# Patient Record
Sex: Female | Born: 1995 | Race: White | Hispanic: No | Marital: Married | Smoking: Former smoker
Health system: Southern US, Community
[De-identification: ages and names within clinical notes are randomized; demographics above are authoritative.]

## PROBLEM LIST (undated history)

## (undated) DIAGNOSIS — F419 Anxiety disorder, unspecified: Secondary | ICD-10-CM

## (undated) DIAGNOSIS — F319 Bipolar disorder, unspecified: Secondary | ICD-10-CM

## (undated) DIAGNOSIS — F4310 Post-traumatic stress disorder, unspecified: Secondary | ICD-10-CM

## (undated) DIAGNOSIS — F329 Major depressive disorder, single episode, unspecified: Secondary | ICD-10-CM

## (undated) DIAGNOSIS — F32A Depression, unspecified: Secondary | ICD-10-CM

## (undated) DIAGNOSIS — F1910 Other psychoactive substance abuse, uncomplicated: Secondary | ICD-10-CM

## (undated) DIAGNOSIS — F909 Attention-deficit hyperactivity disorder, unspecified type: Secondary | ICD-10-CM

---
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
## 2014-12-22 ENCOUNTER — Other Ambulatory Visit: Payer: Self-pay | Admitting: Physician Assistant

## 2017-05-08 ENCOUNTER — Emergency Department (HOSPITAL_COMMUNITY)
Admission: EM | Admit: 2017-05-08 | Discharge: 2017-05-09 | Disposition: A | Discharge status: Home / Self Care | Payer: BLUE CROSS/BLUE SHIELD | Attending: Emergency Medicine | Admitting: Emergency Medicine

## 2017-05-08 ENCOUNTER — Other Ambulatory Visit: Payer: Self-pay

## 2017-05-08 ENCOUNTER — Emergency Department (HOSPITAL_COMMUNITY): Payer: BLUE CROSS/BLUE SHIELD

## 2017-05-08 ENCOUNTER — Encounter (HOSPITAL_COMMUNITY): Payer: Self-pay | Admitting: Emergency Medicine

## 2017-05-08 DIAGNOSIS — L03113 Cellulitis of right upper limb: Secondary | ICD-10-CM | POA: Diagnosis not present

## 2017-05-08 DIAGNOSIS — Z79899 Other long term (current) drug therapy: Secondary | ICD-10-CM | POA: Insufficient documentation

## 2017-05-08 DIAGNOSIS — F17210 Nicotine dependence, cigarettes, uncomplicated: Secondary | ICD-10-CM | POA: Diagnosis not present

## 2017-05-08 DIAGNOSIS — L03119 Cellulitis of unspecified part of limb: Secondary | ICD-10-CM

## 2017-05-08 HISTORY — DX: Other psychoactive substance abuse, uncomplicated: F19.10

## 2017-05-08 LAB — CBC WITH DIFFERENTIAL/PLATELET
Basophils Absolute: 0 10*3/uL (ref 0.0–0.1)
Basophils Relative: 1 %
Eosinophils Absolute: 0.3 10*3/uL (ref 0.0–0.7)
Eosinophils Relative: 4 %
HCT: 38 % (ref 36.0–46.0)
Hemoglobin: 13 g/dL (ref 12.0–15.0)
Lymphocytes Relative: 25 %
Lymphs Abs: 1.6 10*3/uL (ref 0.7–4.0)
MCH: 32.1 pg (ref 26.0–34.0)
MCHC: 34.2 g/dL (ref 30.0–36.0)
MCV: 93.8 fL (ref 78.0–100.0)
Monocytes Absolute: 0.4 10*3/uL (ref 0.1–1.0)
Monocytes Relative: 6 %
Neutro Abs: 4 10*3/uL (ref 1.7–7.7)
Neutrophils Relative %: 64 %
Platelets: 249 10*3/uL (ref 150–400)
RBC: 4.05 MIL/uL (ref 3.87–5.11)
RDW: 12 % (ref 11.5–15.5)
WBC: 6.2 10*3/uL (ref 4.0–10.5)

## 2017-05-08 LAB — RAPID URINE DRUG SCREEN, HOSP PERFORMED
Amphetamines: POSITIVE — AB
Barbiturates: NOT DETECTED
Benzodiazepines: POSITIVE — AB
Cocaine: NOT DETECTED
Opiates: POSITIVE — AB
Tetrahydrocannabinol: POSITIVE — AB

## 2017-05-08 LAB — COMPREHENSIVE METABOLIC PANEL
ALT: 13 U/L — ABNORMAL LOW (ref 14–54)
AST: 19 U/L (ref 15–41)
Albumin: 3.7 g/dL (ref 3.5–5.0)
Alkaline Phosphatase: 72 U/L (ref 38–126)
Anion gap: 8 (ref 5–15)
BUN: 9 mg/dL (ref 6–20)
CO2: 25 mmol/L (ref 22–32)
Calcium: 9.1 mg/dL (ref 8.9–10.3)
Chloride: 105 mmol/L (ref 101–111)
Creatinine, Ser: 0.7 mg/dL (ref 0.44–1.00)
GFR calc Af Amer: >60 mL/min (ref >60)
GFR calc non Af Amer: >60 mL/min (ref >60)
Glucose, Bld: 75 mg/dL (ref 65–99)
Potassium: 4 mmol/L (ref 3.5–5.1)
Sodium: 138 mmol/L (ref 135–145)
Total Bilirubin: 0.4 mg/dL (ref 0.3–1.2)
Total Protein: 7.3 g/dL (ref 6.5–8.1)

## 2017-05-08 LAB — URINALYSIS, ROUTINE W REFLEX MICROSCOPIC
Bacteria, UA: NONE SEEN
Bilirubin Urine: NEGATIVE
Glucose, UA: NEGATIVE mg/dL
Hgb urine dipstick: NEGATIVE
Ketones, ur: NEGATIVE mg/dL
Nitrite: NEGATIVE
Protein, ur: NEGATIVE mg/dL
Specific Gravity, Urine: 1.014 (ref 1.005–1.030)
pH: 6 (ref 5.0–8.0)

## 2017-05-08 LAB — I-STAT CG4 LACTIC ACID, ED: Lactic Acid, Venous: 1.08 mmol/L (ref 0.5–1.9)

## 2017-05-08 MED ORDER — CEPHALEXIN 500 MG PO CAPS
500.0000 mg | ORAL_CAPSULE | Freq: Once | ORAL | Status: AC
  Administered 2017-05-09: 500 mg via ORAL
  Filled 2017-05-08: qty 1

## 2017-05-08 MED ORDER — SULFAMETHOXAZOLE-TRIMETHOPRIM 800-160 MG PO TABS
1.0000 | ORAL_TABLET | Freq: Once | ORAL | Status: AC
  Administered 2017-05-09: 1 via ORAL
  Filled 2017-05-08: qty 1

## 2017-05-08 MED ORDER — SODIUM CHLORIDE 0.9 % IV BOLUS (SEPSIS)
1000.0000 mL | Freq: Once | INTRAVENOUS | Status: AC
  Administered 2017-05-08: 1000 mL via INTRAVENOUS

## 2017-05-08 NOTE — ED Notes (Signed)
Patient transported to X-ray 

## 2017-05-08 NOTE — ED Triage Notes (Signed)
Pt states she has an infection in her right arm  Pt has a red, raised area noted to her right AC  Pt states it is from drug use  States her aunt gave her some antibiotics that she has been taking for the past two days but they are not helping  States area has been there for the past few days

## 2017-05-09 MED ORDER — CEPHALEXIN 500 MG PO CAPS: 500.0000 mg | ORAL_CAPSULE | Freq: Four times a day (QID) | ORAL | 0 refills | Status: AC

## 2017-05-09 MED ORDER — SULFAMETHOXAZOLE-TRIMETHOPRIM 800-160 MG PO TABS: 1.0000 | ORAL_TABLET | Freq: Two times a day (BID) | ORAL | 0 refills | Status: AC

## 2017-05-09 NOTE — Discharge Instructions (Addendum)
Please take all of your antibiotics until finished!   You may develop abdominal discomfort or diarrhea from the antibiotic.  You may help offset this with probiotics which you can buy or get in yogurt. Do not eat  or take the probiotics until 2 hours after your antibiotic.    Alternate 600 mg of ibuprofen and [id number] mg of Tylenol every 3 hours as needed for pain. Do not exceed 4000 mg of Tylenol daily.   Return to the ED in 2 days for wound check.  Return sooner if any concerning signs or symptoms develop such as fever, abnormal drainage, worsening spread of redness.  I have attached outpatient resources to help with substance abuse.  I have also contacted the peer support specialist.  They will call you to help arrange a meeting.

## 2017-05-09 NOTE — ED Provider Notes (Signed)
[facility]-EMERGENCY DEPT Provider Note   CSN: [id number] Arrival date & time: 05/08/17  1819     History   Chief Complaint Chief Complaint  Patient presents with  . Abscess    HPI Joann Price is a 21 y.o. female with history of substance abuse presents today with chief complaint acute onset, progressively worsening area of redness and tenderness to the right arm.  Patient states that she injected meth to the right AC fossa 4 days ago and 2 days later she began developing an area of erythema and tenderness to the injection site.  She notes intermittent aching pain which does not radiate.  She does not think it has been draining.  She denies any fevers or chills.  Denies numbness, tingling, or weakness but has generalized myalgias and fatigue.  She states her aunt gave her an unknown antibiotic which she has had 4 doses of and has not been helpful.  She also takes Adderall, oxycodone (not prescribed to her), and uses marijuana occasionally.  Denies chest pain or shortness of breath.  She appears sleepy on my initial assessment, but patient's boyfriend states that she has been quite tired in the past several days due to withdrawing from methamphetamine use.  He also states that at baseline she is typically very tired and is difficult to awake when she is asleep.  He notes no altered mental status and states she is behaving normally.  The history is provided by the patient and a significant other.    Past Medical History:  Diagnosis Date  . Substance abuse (HCC)     There are no active problems to display for this patient.   History reviewed. No pertinent surgical history.  OB History    No data available       Home Medications    Prior to Admission medications   Medication Sig Start Date End Date Taking? Authorizing Provider  ADDERALL XR 30 MG 24 hr capsule Take 30 mg by mouth daily.  05/06/17  Yes [provider]  amphetamine-dextroamphetamine  (ADDERALL) 20 MG tablet Take 20 mg by mouth daily. 04/23/17  Yes [provider]  clonazePAM (KLONOPIN) 1 MG tablet Take 1 mg by mouth 2 (two) times daily as needed for anxiety.  04/23/17  Yes [provider]  escitalopram (LEXAPRO) 10 MG tablet Take 10 mg by mouth daily. 04/23/17  Yes [provider]  cephALEXin (KEFLEX) 500 MG capsule Take 1 capsule (500 mg total) by mouth 4 (four) times daily for 7 days. 05/09/17 05/16/17  Michela Pitcher A, PA-C  sulfamethoxazole-trimethoprim (BACTRIM DS,SEPTRA DS) 800-160 MG tablet Take 1 tablet by mouth 2 (two) times daily for 7 days. 05/09/17 05/16/17  Jeanie Sewer, PA-C    Family History Family History  Problem Relation Age of Onset  . CAD Other     Social History Social History   Tobacco Use  . Smoking status: Current Some Day Smoker  . Smokeless tobacco: Never Used  Substance Use Topics  . Alcohol use: No    Frequency: Never  . Drug use: Yes    Types: Methamphetamines     Allergies   Patient has no known allergies.   Review of Systems Review of Systems  Constitutional: Positive for fatigue. Negative for chills and fever.  Respiratory: Negative for shortness of breath.   Cardiovascular: Negative for chest pain.  Gastrointestinal: Negative for abdominal pain, nausea and vomiting.  Skin: Positive for color change and wound.  Neurological: Negative  for syncope, weakness and headaches.  Psychiatric/Behavioral: Negative for confusion.  All other systems reviewed and are negative.    Physical Exam Updated Vital Signs BP 102/83   Pulse 84   Temp 99.1 F (37.3 C) (Oral)   Resp 17   LMP 05/01/2017 (Approximate)   SpO2 97%   Physical Exam  Constitutional: She is oriented to person, place, and time. She appears well-developed and well-nourished. No distress.  HENT:  Head: Normocephalic and atraumatic.  Eyes: Conjunctivae and EOM are normal. Pupils are equal, round, and reactive to light. Right eye exhibits  no discharge. Left eye exhibits no discharge.  Neck: No JVD present. No tracheal deviation present.  Cardiovascular: Regular rhythm, normal heart sounds and intact distal pulses.  Tachycardic, 2+ radial pulses bilaterally  Pulmonary/Chest: Effort normal and breath sounds normal.  Abdominal: Soft. Bowel sounds are normal. She exhibits no distension.  Musculoskeletal: Normal range of motion. She exhibits no edema.  Neurological: She is alert and oriented to person, place, and time. No sensory deficit. She exhibits normal muscle tone.  Fluent speech, no facial droop, sensation intact to soft touch of bilateral upper extremities.  She is lying with her eyes closed, but easily arousable and answers questions appropriately.  She follows commands without difficulty.   Skin: Skin is warm and dry. There is erythema.  4 cm x 8 cm area of induration to the volar aspect of the right forearm in the AC fossa.  Tender to palpation.  No drainage noted.  Track marks noted. No fluctuance noted.  Psychiatric: She has a normal mood and affect. Her behavior is normal.  Nursing note and vitals reviewed.    ED Treatments / Results  Labs (all labs ordered are listed, but only abnormal results are displayed) Labs Reviewed  COMPREHENSIVE METABOLIC PANEL - Abnormal; Notable for the following components:      Result Value   ALT 13 (*)    All other components within normal limits  URINALYSIS, ROUTINE W REFLEX MICROSCOPIC - Abnormal; Notable for the following components:   Leukocytes, UA TRACE (*)    Squamous Epithelial / LPF 0-5 (*)    All other components within normal limits  RAPID URINE DRUG SCREEN, HOSP PERFORMED - Abnormal; Notable for the following components:   Opiates POSITIVE (*)    Benzodiazepines POSITIVE (*)    Amphetamines POSITIVE (*)    Tetrahydrocannabinol POSITIVE (*)    All other components within normal limits  CULTURE, BLOOD (ROUTINE X 2)  CULTURE, BLOOD (ROUTINE X 2)  CBC WITH  DIFFERENTIAL/PLATELET  I-STAT CG4 LACTIC ACID, ED  I-STAT CG4 LACTIC ACID, ED    EKG  EKG Interpretation  Date/Time:  Wednesday May 08 2017 20:52:16 EST Ventricular Rate:  102 PR Interval:    QRS Duration: 85 QT Interval:  347 QTC Calculation: 452 R Axis:   109 Text Interpretation:  Sinus tachycardia Borderline right axis deviation No significant change since last tracing Confirmed by Alvira MondaySchlossman, Erin ([id number]) on 05/08/2017 10:27:00 PM       Radiology Dg Elbow Complete Right  Result Date: 05/08/2017 CLINICAL DATA:  Above elbow abscess. EXAM: RIGHT ELBOW - COMPLETE 3+ VIEW COMPARISON:  None. FINDINGS: There is no evidence of fracture, dislocation, or joint effusion. There is no evidence of arthropathy or other focal bone abnormality. Anterior soft tissue swelling above and below the elbow. IMPRESSION: No evidence of osseous abnormalities. Anterior soft tissue swelling. Electronically Signed   By: Ted Mcalpineobrinka  Dimitrova M.D.   On: 05/08/2017 23:30  Procedures Procedures (including critical care time) EMERGENCY DEPARTMENT US SOFT TISSUE INTERPRETATION "Study: Limited Soft Tissue Ultrasound"  INDICATIONS: Soft tissue infection Multiple views of the body part were obtained in real-time with a multi-frequency linear probe  PERFORMED BY: Myself IMAGES ARCHIVED?: Yes SIDE:Right  BODY PART:Upper extremity INTERPRETATION:  Cellulitis present    Medications Ordered in ED Medications  sodium chloride 0.9 % bolus 1,000 mL (0 mLs Intravenous Stopped 05/08/17 2241)  sulfamethoxazole-trimethoprim (BACTRIM DS,SEPTRA DS) 800-160 MG per tablet 1 tablet (1 tablet Oral Given 05/09/17 0002)  cephALEXin (KEFLEX) capsule 500 mg (500 mg Oral Given 05/09/17 0002)     Initial Impression / Assessment and Plan / ED Course  I have reviewed the triage vital signs and the nursing notes.  Pertinent labs & imaging results that were available during my care of the patient were reviewed by me and  considered in my medical decision making (see chart for details).     Patient presents with right antecubital fossa cellulitis secondary to methamphetamine use.  Afebrile, initially tachycardic with improvement while in the ED.  She is drowsy but easily arousable while in the ED and patient's boyfriend states that this is her baseline.  Radiographs reviewed by me show no fracture or dislocation or retained foreign bodies such as needles.  Does show anterior soft tissue swelling.  UDS is positive for opiates, benzos, amphetamines, and THC.  No lactic acidosis.  No leukocytosis, no significant electrolyte abnormalities.  Bedside ultrasound performed showing evidence of cellulitis but no evidence of definitive abscess.  Low suspicion of DVT. Will discharge with Bactrim and Keflex, area of erythema and induration marked by the nurse.  Instructed patient and boyfriend to return to the ED if any concerning signs or symptoms develop such as abnormal drainage, fevers, or worsening spread of redness or streaking.  She will follow-up with PCP for reevaluation.  She was also given outpatient resources for substance abuse and peer support specialists were consulted. Pt and patient's boyfriend verbalized understanding of and agreement with plan and patient is safe for discharge home at this time.  Final Clinical Impressions(s) / ED Diagnoses   Final diagnoses:  Cellulitis of antecubital fossa    ED Discharge Orders        Ordered    sulfamethoxazole-trimethoprim (BACTRIM DS,SEPTRA DS) 800-160 MG tablet  2 times daily     05/09/17 0000    cephALEXin (KEFLEX) 500 MG capsule  4 times daily     05/09/17 0000       Jeanie SewerFawze, Newman Waren A, PA-C 05/09/17 [id number]    Alvira MondaySchlossman, Erin, MD 05/10/17 1451

## 2017-05-14 LAB — CULTURE, BLOOD (ROUTINE X 2)
Culture: NO GROWTH
Culture: NO GROWTH
Special Requests: ADEQUATE

## 2017-10-14 ENCOUNTER — Emergency Department (HOSPITAL_COMMUNITY): Payer: PRIVATE HEALTH INSURANCE

## 2017-10-14 ENCOUNTER — Emergency Department (HOSPITAL_COMMUNITY)
Admission: EM | Admit: 2017-10-14 | Discharge: 2017-10-14 | Disposition: A | Discharge status: Home / Self Care | Payer: PRIVATE HEALTH INSURANCE | Attending: Emergency Medicine | Admitting: Emergency Medicine

## 2017-10-14 DIAGNOSIS — Z79899 Other long term (current) drug therapy: Secondary | ICD-10-CM | POA: Insufficient documentation

## 2017-10-14 DIAGNOSIS — F1910 Other psychoactive substance abuse, uncomplicated: Secondary | ICD-10-CM | POA: Insufficient documentation

## 2017-10-14 LAB — CBC WITH DIFFERENTIAL/PLATELET
BASOS ABS: 0 10*3/uL (ref 0.0–0.1)
BASOS PCT: 1 %
EOS PCT: 2 %
Eosinophils Absolute: 0.1 10*3/uL (ref 0.0–0.7)
HEMATOCRIT: 37.7 % (ref 36.0–46.0)
Hemoglobin: 12.6 g/dL (ref 12.0–15.0)
Lymphocytes Relative: 40 %
Lymphs Abs: 1.7 10*3/uL (ref 0.7–4.0)
MCH: 31.4 pg (ref 26.0–34.0)
MCHC: 33.4 g/dL (ref 30.0–36.0)
MCV: 94 fL (ref 78.0–100.0)
MONO ABS: 0.3 10*3/uL (ref 0.1–1.0)
MONOS PCT: 7 %
NEUTROS ABS: 2.1 10*3/uL (ref 1.7–7.7)
Neutrophils Relative %: 50 %
PLATELETS: 216 10*3/uL (ref 150–400)
RBC: 4.01 MIL/uL (ref 3.87–5.11)
RDW: 12 % (ref 11.5–15.5)
WBC: 4.1 10*3/uL (ref 4.0–10.5)

## 2017-10-14 LAB — COMPREHENSIVE METABOLIC PANEL
ALBUMIN: 4.4 g/dL (ref 3.5–5.0)
ALT: 15 U/L (ref 14–54)
ANION GAP: 10 (ref 5–15)
AST: 21 U/L (ref 15–41)
Alkaline Phosphatase: 76 U/L (ref 38–126)
BILIRUBIN TOTAL: 0.3 mg/dL (ref 0.3–1.2)
BUN: 10 mg/dL (ref 6–20)
CHLORIDE: 110 mmol/L (ref 101–111)
CO2: 25 mmol/L (ref 22–32)
Calcium: 9.2 mg/dL (ref 8.9–10.3)
Creatinine, Ser: 0.73 mg/dL (ref 0.44–1.00)
GFR calc Af Amer: >60 mL/min (ref >60)
Glucose, Bld: 98 mg/dL (ref 65–99)
POTASSIUM: 4.1 mmol/L (ref 3.5–5.1)
Sodium: 145 mmol/L (ref 135–145)
TOTAL PROTEIN: 7.8 g/dL (ref 6.5–8.1)

## 2017-10-14 LAB — I-STAT CHEM 8, ED
BUN: 8 mg/dL (ref 6–20)
CHLORIDE: 107 mmol/L (ref 101–111)
Calcium, Ion: 1.17 mmol/L (ref 1.15–1.40)
Creatinine, Ser: 0.7 mg/dL (ref 0.44–1.00)
Glucose, Bld: 94 mg/dL (ref 65–99)
HCT: 36 % (ref 36.0–46.0)
Hemoglobin: 12.2 g/dL (ref 12.0–15.0)
POTASSIUM: 4.1 mmol/L (ref 3.5–5.1)
SODIUM: 145 mmol/L (ref 135–145)
TCO2: 26 mmol/L (ref 22–32)

## 2017-10-14 LAB — ETHANOL

## 2017-10-14 MED ORDER — SODIUM CHLORIDE 0.9 % IV BOLUS: 1000.0000 mL | Freq: Once | INTRAVENOUS | Status: DC

## 2017-10-14 MED ORDER — NALOXONE HCL 2 MG/2ML IJ SOSY
2.0000 mg | PREFILLED_SYRINGE | Freq: Once | INTRAMUSCULAR | Status: DC
  Filled 2017-10-14: qty 2

## 2017-10-14 NOTE — ED Notes (Signed)
Dr Zammit at bedside. 

## 2017-10-14 NOTE — ED Notes (Signed)
Bed: WA23 Expected date:  Expected time:  Means of arrival:  Comments: EMS 

## 2017-10-14 NOTE — ED Provider Notes (Signed)
[facility]-EMERGENCY DEPT Provider Note   CSN: [id number] Arrival date & time: 10/14/17  0808     History   Chief Complaint Chief Complaint  Patient presents with  . Drug Overdose    HPI Joann Price is a 22 y.o. female.  Patient was brought in by paramedics not responding anything but painful stimuli.   According to the person I was with her he was trying to do a tattoo on her and she became unresponsive and [place]inn on herself.  States that way and he called paramedics.  Patient supposedly had been using some methamphetamines prior to the tattoo session  The history is provided by the patient and a friend. No language interpreter was used.  Drug Overdose  This is a recurrent problem. The current episode started 1 to 2 hours ago. The problem occurs every several days. The problem has not changed since onset.Pertinent negatives include no chest pain and no abdominal pain. Nothing aggravates the symptoms. Nothing relieves the symptoms. She has tried nothing for the symptoms. The treatment provided no relief.    Past Medical History:  Diagnosis Date  . Substance abuse (HCC)     There are no active problems to display for this patient.   No past surgical history on file.   OB History   None      Home Medications    Prior to Admission medications   Medication Sig Start Date End Date Taking? Authorizing Provider  ibuprofen (ADVIL,MOTRIN) 200 MG tablet Take 800 mg by mouth every 6 (six) hours as needed for moderate pain.   Yes [provider]    Family History Family History  Problem Relation Age of Onset  . CAD Other     Social History Social History   Tobacco Use  . Smoking status: Current Some Day Smoker  . Smokeless tobacco: Never Used  Substance Use Topics  . Alcohol use: No    Frequency: Never  . Drug use: Yes    Types: Methamphetamines     Allergies   Patient has no known allergies.   Review of Systems Review of  Systems  Unable to perform ROS: Mental status change  Cardiovascular: Negative for chest pain.  Gastrointestinal: Negative for abdominal pain.     Physical Exam Updated Vital Signs BP 118/87   Pulse 66   Temp 98.9 F (37.2 C) (Rectal)   Resp 16   SpO2 100%   Physical Exam  Constitutional: She appears well-developed.  HENT:  Head: Normocephalic.  Patient did have a very good gag reflex  Eyes: Conjunctivae and EOM are normal. No scleral icterus.  Neck: Neck supple. No thyromegaly present.  Cardiovascular: Normal rate and regular rhythm. Exam reveals no gallop and no friction rub.  No murmur heard. Pulmonary/Chest: No stridor. She has no wheezes. She has no rales. She exhibits no tenderness.  Abdominal: She exhibits no distension. There is no tenderness. There is no rebound.  Musculoskeletal: Normal range of motion. She exhibits no edema.  Lymphadenopathy:    She has no cervical adenopathy.  Neurological: She exhibits normal muscle tone. Coordination normal.  She only responding to painful stimuli but moving all extremities  Skin: No rash noted. No erythema.     ED Treatments / Results  Labs (all labs ordered are listed, but only abnormal results are displayed) Labs Reviewed  CBC WITH DIFFERENTIAL/PLATELET  COMPREHENSIVE METABOLIC PANEL  ETHANOL  URINALYSIS, ROUTINE W REFLEX MICROSCOPIC  RAPID URINE DRUG SCREEN, HOSP PERFORMED  I-STAT CHEM 8, ED    EKG None  Radiology Dg Chest Port 1 View  Result Date: 10/14/2017 CLINICAL DATA:  Drug overdose. EXAM: PORTABLE CHEST 1 VIEW COMPARISON:  None in PACs FINDINGS: The left lung is clear. There is mild elevation of the right hemidiaphragm. There is no evidence of pneumonia. The heart and mediastinal structures are normal. The trachea is midline. The bony thorax is unremarkable. IMPRESSION: No definite acute cardiopulmonary abnormality. Mild elevation of the right hemidiaphragm of uncertain etiology and duration.  Electronically Signed   By: David  [place] M.D.   On: 10/14/2017 08:42    Procedures Procedures (including critical care time)  Medications Ordered in ED Medications  naloxone (NARCAN) injection 2 mg (has no administration in time range)  sodium chloride 0.9 % bolus 1,000 mL (has no administration in time range)   CRITICAL CARE Performed by: Bethann Berkshire Total critical care time: 35 minutes Critical care time was exclusive of separately billable procedures and treating other patients. Critical care was necessary to treat or prevent imminent or life-threatening deterioration. Critical care was time spent personally by me on the following activities: development of treatment plan with patient and/or surrogate as well as nursing, discussions with consultants, evaluation of patient's response to treatment, examination of patient, obtaining history from patient or surrogate, ordering and performing treatments and interventions, ordering and review of laboratory studies, ordering and review of radiographic studies, pulse oximetry and re-evaluation of patient's condition.   Initial Impression / Assessment and Plan / ED Course  I have reviewed the triage vital signs and the nursing notes.  Pertinent labs & imaging results that were available during my care of the patient were reviewed by me and considered in my medical decision making (see chart for details). Patient was given Narcan IV but did not help any.  Over time the patient did wake up and stated that she did not want to be taking care of.  She did not want to get CAT scan and did not want to do urine test she just wanted outpatient referrals to substance abuse.  Patient was alert and oriented when she left      Final Clinical Impressions(s) / ED Diagnoses   Final diagnoses:  Polysubstance abuse [facility])    ED Discharge Orders    None       Bethann Berkshire, MD 10/14/17 1034

## 2017-10-14 NOTE — ED Triage Notes (Signed)
Per EMS pt from a  home with a c/o drug overdose. Per EMS pt boyfriend's reported he was tattooing pt when she collapsed. Per EMS pt injected meth, had 3-4 mixed drinks and possibly took 8 ativans prior to the incident. Per EMS pt responsive to painful stimulation.

## 2017-10-14 NOTE — ED Notes (Signed)
Pt's IV has been discontinued at this time.

## 2017-10-14 NOTE — Discharge Instructions (Addendum)
Follow up with out pt tx of substance abuse.   Return if problems

## 2017-10-14 NOTE — ED Notes (Signed)
Pt sts she wants to leave, Dr Estell Harpin notified

## 2018-05-31 ENCOUNTER — Other Ambulatory Visit: Payer: Self-pay

## 2018-05-31 ENCOUNTER — Emergency Department (HOSPITAL_COMMUNITY)
Admission: EM | Admit: 2018-05-31 | Discharge: 2018-05-31 | Discharge status: Against Medical Advice | Payer: Self-pay | Attending: Emergency Medicine | Admitting: Emergency Medicine

## 2018-05-31 ENCOUNTER — Encounter (HOSPITAL_COMMUNITY): Payer: Self-pay | Admitting: *Deleted

## 2018-05-31 DIAGNOSIS — T401X1A Poisoning by heroin, accidental (unintentional), initial encounter: Secondary | ICD-10-CM | POA: Insufficient documentation

## 2018-05-31 DIAGNOSIS — Z5321 Procedure and treatment not carried out due to patient leaving prior to being seen by health care provider: Secondary | ICD-10-CM | POA: Insufficient documentation

## 2018-05-31 NOTE — ED Triage Notes (Signed)
Per EMS, pt OD on Heroin tonight, her boyfriend on scene reported giving the pt narcan twice intranasal and twice IM.  Pt is A&O x 4 and in NAD.  Denies any pain.  She is cooperative but appears anxious.  She is worried how she is getting home.

## 2018-05-31 NOTE — ED Notes (Signed)
Pt's boyfriend showed up and she left AMA

## 2019-01-10 ENCOUNTER — Inpatient Hospital Stay (HOSPITAL_COMMUNITY): Payer: Medicaid Other

## 2019-01-10 ENCOUNTER — Inpatient Hospital Stay (HOSPITAL_COMMUNITY)
Admission: AD | Admit: 2019-01-10 | Discharge: 2019-01-11 | Disposition: A | Discharge status: Home / Self Care | Payer: Medicaid Other | Attending: Family Medicine | Admitting: Family Medicine

## 2019-01-10 ENCOUNTER — Other Ambulatory Visit: Payer: Self-pay

## 2019-01-10 ENCOUNTER — Encounter (HOSPITAL_COMMUNITY): Payer: Self-pay

## 2019-01-10 DIAGNOSIS — O418X10 Other specified disorders of amniotic fluid and membranes, first trimester, not applicable or unspecified: Secondary | ICD-10-CM

## 2019-01-10 DIAGNOSIS — Z3A01 Less than 8 weeks gestation of pregnancy: Secondary | ICD-10-CM | POA: Insufficient documentation

## 2019-01-10 DIAGNOSIS — O468X1 Other antepartum hemorrhage, first trimester: Secondary | ICD-10-CM

## 2019-01-10 DIAGNOSIS — O209 Hemorrhage in early pregnancy, unspecified: Secondary | ICD-10-CM | POA: Diagnosis not present

## 2019-01-10 DIAGNOSIS — Z87891 Personal history of nicotine dependence: Secondary | ICD-10-CM | POA: Diagnosis not present

## 2019-01-10 HISTORY — DX: Post-traumatic stress disorder, unspecified: F43.10

## 2019-01-10 HISTORY — DX: Bipolar disorder, unspecified: F31.9

## 2019-01-10 HISTORY — DX: Depression, unspecified: F32.A

## 2019-01-10 HISTORY — DX: Anxiety disorder, unspecified: F41.9

## 2019-01-10 HISTORY — DX: Attention-deficit hyperactivity disorder, unspecified type: F90.9

## 2019-01-10 LAB — URINALYSIS, ROUTINE W REFLEX MICROSCOPIC
Bilirubin Urine: NEGATIVE
Glucose, UA: NEGATIVE mg/dL
Ketones, ur: NEGATIVE mg/dL
Leukocytes,Ua: NEGATIVE
Nitrite: NEGATIVE
Protein, ur: NEGATIVE mg/dL
Specific Gravity, Urine: 1.001 — ABNORMAL LOW (ref 1.005–1.030)
pH: 6 (ref 5.0–8.0)

## 2019-01-10 LAB — TYPE AND SCREEN
ABO/RH(D): B POS
Antibody Screen: NEGATIVE

## 2019-01-10 LAB — POCT PREGNANCY, URINE: Preg Test, Ur: POSITIVE — AB

## 2019-01-10 LAB — RAPID URINE DRUG SCREEN, HOSP PERFORMED
Amphetamines: NOT DETECTED
Barbiturates: NOT DETECTED
Benzodiazepines: NOT DETECTED
Cocaine: NOT DETECTED
Opiates: NOT DETECTED
Tetrahydrocannabinol: POSITIVE — AB

## 2019-01-10 LAB — CBC
HCT: 37.2 % (ref 36.0–46.0)
Hemoglobin: 12.3 g/dL (ref 12.0–15.0)
MCH: 32.2 pg (ref 26.0–34.0)
MCHC: 33.1 g/dL (ref 30.0–36.0)
MCV: 97.4 fL (ref 80.0–100.0)
Platelets: 249 10*3/uL (ref 150–400)
RBC: 3.82 MIL/uL — ABNORMAL LOW (ref 3.87–5.11)
RDW: 11.6 % (ref 11.5–15.5)
WBC: 8.2 10*3/uL (ref 4.0–10.5)
nRBC: 0 % (ref 0.0–0.2)

## 2019-01-10 LAB — WET PREP, GENITAL
Clue Cells Wet Prep HPF POC: NONE SEEN
Sperm: NONE SEEN
Trich, Wet Prep: NONE SEEN
Yeast Wet Prep HPF POC: NONE SEEN

## 2019-01-10 LAB — HCG, QUANTITATIVE, PREGNANCY: hCG, Beta Chain, Quant, S: 61898 m[IU]/mL — ABNORMAL HIGH (ref <5)

## 2019-01-10 MED ORDER — PREPLUS 27-1 MG PO TABS: 1.0000 | ORAL_TABLET | Freq: Once | ORAL | 3 refills | Status: AC

## 2019-01-10 NOTE — MAU Provider Note (Signed)
History     CSN: [id number]  Arrival date and time: 01/10/19 1938   None     Chief Complaint  Patient presents with  . Vaginal Bleeding  . Possible Pregnancy   Micael HampshireRioux Larose is a 23 y.o. G2P0010 at Unknown who receives care.  She presents today for Vaginal Bleeding and Possible Pregnancy.  She states she started having light bright red spotting today around 6pm.  She states she did not wear a pad, but it was "a lot and ruined my underwear."  Patient reports that she had some cramping that started after the bleeding, but it has since subsided.  Patient denies vaginal concerns including discharge, itching, or odor prior to the bleeding.  Patient reports that she took a home UPT last Saturday that was positive, but she is unsure of her LMP.  Patient states she does not have regular periods.    Of Note when provider walked in the room patient was standing up and SO was laying in the bed sleep.  After exam, patient heard yelling at SO and nurses in room to assess.  Patient endorses safety and declines need for further assistance or removal of SO from room.       OB History    Gravida  2   Para      Term      Preterm      AB  1   Living        SAB      TAB  1   Ectopic      Multiple      Live Births              Past Medical History:  Diagnosis Date  . ADHD   . Anxiety   . Bipolar 1 disorder (HCC)   . Depression   . PTSD (post-traumatic stress disorder)   . Substance abuse (HCC)     No past surgical history on file.  Family History  Problem Relation Age of Onset  . CAD Other     Social History   Tobacco Use  . Smoking status: Former Smoker    Types: Cigarettes    Quit date: 01/03/2019    Years since quitting: 0.0  . Smokeless tobacco: Never Used  Substance Use Topics  . Alcohol use: No    Frequency: Never  . Drug use: Yes    Types: Methamphetamines    Allergies: No Known Allergies  Medications Prior to Admission  Medication Sig Dispense  Refill Last Dose  . ibuprofen (ADVIL,MOTRIN) 200 MG tablet Take 800 mg by mouth every 6 (six) hours as needed for moderate pain.       Review of Systems  Constitutional: Negative for chills and fever.  Respiratory: Negative for cough and shortness of breath.   Gastrointestinal: Negative for abdominal pain, constipation, diarrhea, nausea and vomiting.  Genitourinary: Positive for vaginal bleeding. Negative for difficulty urinating, dysuria and vaginal discharge.  Musculoskeletal: Positive for back pain.  Neurological: Negative for dizziness, light-headedness and headaches.   Physical Exam   Blood pressure 106/70, pulse 94, temperature 98.7 F (37.1 C), temperature source Oral, resp. rate 18, height 5\' 1"  (1.549 m), weight 65.4 kg, SpO2 100 %.  Physical Exam  Constitutional: She is oriented to person, place, and time. She appears well-developed and well-nourished.  HENT:  Head: Normocephalic and atraumatic.  Eyes: Conjunctivae are normal.  Neck: Normal range of motion.  Cardiovascular: Normal rate.  Respiratory: Effort normal.  GI: Soft.  Genitourinary: Cervix exhibits no motion tenderness, no discharge and no friability.    Vaginal bleeding present.  There is bleeding in the vagina.    Genitourinary Comments: Speculum Exam: NEFG -Vaginal Vault: Pink mucosa with good rugae.  Small amt dark red blood in vault removed with faux swab x 2 -wet prep collected -Cervix:Pink, no lesions, cysts, or polyps.  Appears closed. No active bleeding from os-GC/CT collected -Bimanual Exam: Closed. Mild tenderness in cul de sac-right side.   Musculoskeletal: Normal range of motion.  Neurological: She is alert and oriented to person, place, and time.  Skin: Skin is warm and dry.  Psychiatric: She has a normal mood and affect. Her behavior is normal.    MAU Course  Procedures Results for orders placed or performed during the hospital encounter of 01/10/19 (from the past 24 hour(s))  Urinalysis,  Routine w reflex microscopic     Status: Abnormal   Collection Time: 01/10/19  8:48 PM  Result Value Ref Range   Color, Urine COLORLESS (A) YELLOW   APPearance CLEAR CLEAR   Specific Gravity, Urine 1.001 (L) 1.005 - 1.030   pH 6.0 5.0 - 8.0   Glucose, UA NEGATIVE NEGATIVE mg/dL   Hgb urine dipstick LARGE (A) NEGATIVE   Bilirubin Urine NEGATIVE NEGATIVE   Ketones, ur NEGATIVE NEGATIVE mg/dL   Protein, ur NEGATIVE NEGATIVE mg/dL   Nitrite NEGATIVE NEGATIVE   Leukocytes,Ua NEGATIVE NEGATIVE   RBC / HPF 0-5 0 - 5 RBC/hpf   WBC, UA 0-5 0 - 5 WBC/hpf   Bacteria, UA RARE (A) NONE SEEN   Squamous Epithelial / LPF 0-5 0 - 5  Rapid urine drug screen (hospital performed)     Status: Abnormal   Collection Time: 01/10/19  8:48 PM  Result Value Ref Range   Opiates NONE DETECTED NONE DETECTED   Cocaine NONE DETECTED NONE DETECTED   Benzodiazepines NONE DETECTED NONE DETECTED   Amphetamines NONE DETECTED NONE DETECTED   Tetrahydrocannabinol POSITIVE (A) NONE DETECTED   Barbiturates NONE DETECTED NONE DETECTED  Pregnancy, urine POC     Status: Abnormal   Collection Time: 01/10/19  8:49 PM  Result Value Ref Range   Preg Test, Ur POSITIVE (A) NEGATIVE  Wet prep, genital     Status: Abnormal   Collection Time: 01/10/19 10:34 PM   Specimen: Thin Prep Cervical/Endocervical  Result Value Ref Range   Yeast Wet Prep HPF POC NONE SEEN NONE SEEN   Trich, Wet Prep NONE SEEN NONE SEEN   Clue Cells Wet Prep HPF POC NONE SEEN NONE SEEN   WBC, Wet Prep HPF POC FEW (A) NONE SEEN   Sperm NONE SEEN   hCG, quantitative, pregnancy     Status: Abnormal   Collection Time: 01/10/19 10:40 PM  Result Value Ref Range   hCG, Beta Chain, Quant, S 61,898 (H) <5 mIU/mL  CBC     Status: Abnormal   Collection Time: 01/10/19 10:40 PM  Result Value Ref Range   WBC 8.2 4.0 - 10.5 K/uL   RBC 3.82 (L) 3.87 - 5.11 MIL/uL   Hemoglobin 12.3 12.0 - 15.0 g/dL   HCT 37.2 36.0 - 46.0 %   MCV 97.4 80.0 - 100.0 fL   MCH 32.2  26.0 - 34.0 pg   MCHC 33.1 30.0 - 36.0 g/dL   RDW 11.6 11.5 - 15.5 %   Platelets 249 150 - 400 K/uL   nRBC 0.0 0.0 - 0.2 %  Type and screen  Status: None   Collection Time: 01/10/19 10:40 PM  Result Value Ref Range   ABO/RH(D) B POS    Antibody Screen NEG    Sample Expiration      01/13/2019,2359 Performed at [facility], [address] [place], [place] [address]   ABO/Rh     Status: None (Preliminary result)   Collection Time: 01/10/19 10:40 PM  Result Value Ref Range   ABO/RH(D)      B POS Performed at [facility], [address] [place], [place] [address]    [place] Ob Less Than 14 Weeks With Ob Transvaginal  Result Date: 01/10/2019 CLINICAL DATA:  Nine weeks pregnant with vaginal bleeding EXAM: OBSTETRIC <14 WK US AND TRANSVAGINAL OB US TECHNIQUE: Both transabdominal and transvaginal ultrasound examinations were performed for complete evaluation of the gestation as well as the maternal uterus, adnexal regions, and pelvic cul-de-sac. Transvaginal technique was performed to assess early pregnancy. COMPARISON:  None. FINDINGS: Intrauterine gestational sac: Present Yolk sac:  Present Embryo:  Present Cardiac Activity: Present Heart Rate: 140 bpm CRL:  10.2 mm mm   7 w   1 d                  US EDC: 08/28/2019 Subchorionic hemorrhage: Moderate subchorionic hemorrhage is noted measuring approximately 2.6 x 2.1 cm. Maternal uterus/adnexae: Within normal limits. IMPRESSION: Single live intrauterine gestation at 7 weeks 1 day. Moderate subchorionic hemorrhage is noted. Electronically Signed   By: Alcide CleverMark  Lukens M.D.   On: 01/10/2019 23:51    MDM Pelvic Exam; Wet Prep and GC/CT Labs: UA, UPT, CBC, hCG, T&S, Drug Screen Ultrasound Assessment and Plan  23 year old G2P0010 at Unknown [place] Vaginal Bleeding  -Exam findings discussed. -POC discussed as above. -Informed that due to history of polysubstance abuse and overdose, will collect drug screen today. -Patient verbalizes  understanding and states she has not taken any drugs x 1 week. -Will send for US and await results.   Cherre RobinsJessica L Rania Prothero MSN, CNM 01/10/2019, 10:10 PM   Reassessment (12:02 AM) [facility]  -US and labs returned with findings as above. -Results discussed with patient. -Informed that GC/CT will return within 2-3 days. -Given EDD of 08/28/2019 -Rx for PNV sent to pharmacy on file.  -Informed of blood type of B+. -Instructed to discontinue usage of ibuprofen. -Discussed SCH findings of US and instructions given regarding bleeding and pelvic rest.  -[facility] -Will provide information on providers in the community. -Patient requests proof of pregnancy paperwork. -No other questions or concerns. -Encouraged to call or return to MAU if symptoms worsen or with the onset of new symptoms. -Discharged to home in stable condition.  Cherre RobinsJessica L Rachid Parham MSN, CNM

## 2019-01-10 NOTE — MAU Note (Signed)
Pt presents to MAU reporting a few days ago she had a +UPT and today she started bleeding. Pt is unsure of her LMP or her dating. Pt reports some cramping in her lower abdomen that is a 1-2/10.

## 2019-01-10 NOTE — MAU Note (Signed)
Pt walked to [place]. Pt reports feeling safe in her relationship.

## 2019-01-11 LAB — ABO/RH: ABO/RH(D): B POS

## 2019-01-11 NOTE — Discharge Instructions (Signed)
[place] for [place] at [facility]       Phone: [phone]  [facility] for [place] at [place] Phone: [place] for [employer] at [place]  Phone: [place] for [place] at [facility]  Phone: [place] for [place] at [facility]  Phone: [facility]       Phone: [phone]  [facility]    Phone: [phone]   [facility])    Phone: [facility]    Phone: [phone]  [facility]    Phone: [place] [facility]  Phone: [phone]  [place]    Phone: [phone]  [facility]   Phone: [phone]  [facility]    Phone: [phone] First Trimester of Pregnancy The first trimester of pregnancy is from week 1 until the end of week 13 (months 1 through 3). A week after a sperm fertilizes an egg, the egg will implant on the wall of the uterus. This embryo will begin to develop into a baby. Genes from you and your partner will form the baby. The female genes will determine whether the baby will be a boy or a girl. At 6-8 weeks, the eyes and face will be formed, and the heartbeat can be seen on ultrasound. At the end of 12 weeks, all the baby's organs will be formed. Now that you are pregnant, you will want to do everything you can to have a healthy baby. Two of the most important things are to get good prenatal care and to follow your health care provider's instructions. Prenatal care is all the medical care you receive before the baby's birth. This care will help prevent, find, and treat any problems during the pregnancy and childbirth. Body changes during your first trimester Your body goes through many changes during  pregnancy. The changes vary from woman to woman.  You may gain or lose a couple of pounds at first.  You may feel sick to your stomach (nauseous) and you may throw up (vomit). If the vomiting is uncontrollable, call your health care provider.  You may tire easily.  You may develop headaches that can be relieved by medicines. All medicines should be approved by your health care provider.  You may urinate more often. Painful urination may mean you have a bladder infection.  You may develop heartburn as a result of your pregnancy.  You may develop constipation because certain hormones are causing the muscles that push stool through your intestines to slow down.  You may develop hemorrhoids or swollen veins (varicose veins).  Your breasts may begin to grow larger and become tender. Your nipples may stick out more, and the tissue that surrounds them (areola) may become darker.  Your gums may bleed and may be sensitive to brushing and flossing.  Dark spots or blotches (chloasma, mask of pregnancy) may develop on your face. This will likely fade after the baby is born.  Your menstrual periods will stop.  You may have a loss of appetite.  You may develop cravings for certain kinds of food.  You may have changes in your emotions from day to day, such as being excited to be pregnant or being concerned that something may go wrong with the pregnancy and baby.  You may have more vivid and strange dreams.  You  may have changes in your hair. These can include thickening of your hair, rapid growth, and changes in texture. Some women also have hair loss during or after pregnancy, or hair that feels dry or thin. Your hair will most likely return to normal after your baby is born. What to expect at prenatal visits During a routine prenatal visit:  You will be weighed to make sure you and the baby are growing normally.  Your blood pressure will be taken.  Your abdomen will be measured to track  your baby's growth.  The fetal heartbeat will be listened to between weeks 10 and 14 of your pregnancy.  Test results from any previous visits will be discussed. Your health care provider may ask you:  How you are feeling.  If you are feeling the baby move.  If you have had any abnormal symptoms, such as leaking fluid, bleeding, severe headaches, or abdominal cramping.  If you are using any tobacco products, including cigarettes, chewing tobacco, and electronic cigarettes.  If you have any questions. Other tests that may be performed during your first trimester include:  Blood tests to find your blood type and to check for the presence of any previous infections. The tests will also be used to check for low iron levels (anemia) and protein on red blood cells (Rh antibodies). Depending on your risk factors, or if you previously had diabetes during pregnancy, you may have tests to check for high blood sugar that affects pregnant women (gestational diabetes).  Urine tests to check for infections, diabetes, or protein in the urine.  An ultrasound to confirm the proper growth and development of the baby.  Fetal screens for spinal cord problems (spina bifida) and Down syndrome.  HIV (human immunodeficiency virus) testing. Routine prenatal testing includes screening for HIV, unless you choose not to have this test.  You may need other tests to make sure you and the baby are doing well. Follow these instructions at home: Medicines  Follow your health care provider's instructions regarding medicine use. Specific medicines may be either safe or unsafe to take during pregnancy.  Take a prenatal vitamin that contains at least 600 micrograms (mcg) of folic acid.  If you develop constipation, try taking a stool softener if your health care provider approves. Eating and drinking   Eat a balanced diet that includes fresh fruits and vegetables, whole grains, good sources of protein such as  meat, eggs, or tofu, and low-fat dairy. Your health care provider will help you determine the amount of weight gain that is right for you.  Avoid raw meat and uncooked cheese. These carry germs that can cause birth defects in the baby.  Eating four or five small meals rather than three large meals a day may help relieve nausea and vomiting. If you start to feel nauseous, eating a few soda crackers can be helpful. Drinking liquids between meals, instead of during meals, also seems to help ease nausea and vomiting.  Limit foods that are high in fat and processed sugars, such as fried and sweet foods.  To prevent constipation: ? Eat foods that are high in fiber, such as fresh fruits and vegetables, whole grains, and beans. ? Drink enough fluid to keep your urine clear or pale yellow. Activity  Exercise only as directed by your health care provider. Most women can continue their usual exercise routine during pregnancy. Try to exercise for 30 minutes at least 5 days a week. Exercising will help you: ? Control your  weight. ? Stay in shape. ? Be prepared for labor and delivery.  Experiencing pain or cramping in the lower abdomen or lower back is a good sign that you should stop exercising. Check with your health care provider before continuing with normal exercises.  Try to avoid standing for long periods of time. Move your legs often if you must stand in one place for a long time.  Avoid heavy lifting.  Wear low-heeled shoes and practice good posture.  You may continue to have sex unless your health care provider tells you not to. Relieving pain and discomfort  Wear a good support bra to relieve breast tenderness.  Take warm sitz baths to soothe any pain or discomfort caused by hemorrhoids. Use hemorrhoid cream if your health care provider approves.  Rest with your legs elevated if you have leg cramps or low back pain.  If you develop varicose veins in your legs, wear support hose.  Elevate your feet for 15 minutes, 3-4 times a day. Limit salt in your diet. Prenatal care  Schedule your prenatal visits by the twelfth week of pregnancy. They are usually scheduled monthly at first, then more often in the last 2 months before delivery.  Write down your questions. Take them to your prenatal visits.  Keep all your prenatal visits as told by your health care provider. This is important. Safety  Wear your seat belt at all times when driving.  Make a list of emergency phone numbers, including numbers for family, friends, the hospital, and police and fire departments. General instructions  Ask your health care provider for a referral to a local prenatal education class. Begin classes no later than the beginning of month 6 of your pregnancy.  Ask for help if you have counseling or nutritional needs during pregnancy. Your health care provider can offer advice or refer you to specialists for help with various needs.  Do not use hot tubs, steam rooms, or saunas.  Do not douche or use tampons or scented sanitary pads.  Do not cross your legs for long periods of time.  Avoid cat litter boxes and soil used by cats. These carry germs that can cause birth defects in the baby and possibly loss of the fetus by miscarriage or stillbirth.  Avoid all smoking, herbs, alcohol, and medicines not prescribed by your health care provider. Chemicals in these products affect the formation and growth of the baby.  Do not use any products that contain nicotine or tobacco, such as cigarettes and e-cigarettes. If you need help quitting, ask your health care provider. You may receive counseling support and other resources to help you quit.  Schedule a dentist appointment. At home, brush your teeth with a soft toothbrush and be gentle when you floss. Contact a health care provider if:  You have dizziness.  You have mild pelvic cramps, pelvic pressure, or nagging pain in the abdominal area.  You  have persistent nausea, vomiting, or diarrhea.  You have a bad smelling vaginal discharge.  You have pain when you urinate.  You notice increased swelling in your face, hands, legs, or ankles.  You are exposed to fifth disease or chickenpox.  You are exposed to [place] measles (rubella) and have never had it. Get help right away if:  You have a fever.  You are leaking fluid from your vagina.  You have spotting or bleeding from your vagina.  You have severe abdominal cramping or pain.  You have rapid weight gain or loss.  You vomit blood or material that looks like coffee grounds.  You develop a severe headache.  You have shortness of breath.  You have any kind of trauma, such as from a fall or a car accident. Summary  The first trimester of pregnancy is from week 1 until the end of week 13 (months 1 through 3).  Your body goes through many changes during pregnancy. The changes vary from woman to woman.  You will have routine prenatal visits. During those visits, your health care provider will examine you, discuss any test results you may have, and talk with you about how you are feeling. This information is not intended to replace advice given to you by your health care provider. Make sure you discuss any questions you have with your health care provider. Document Released: 05/15/2001 Document Revised: 05/03/2017 Document Reviewed: 05/02/2016 Elsevier Patient Education  2020 [employer] Inc. Subchorionic Hematoma  A subchorionic hematoma is a gathering of blood between the outer wall of the embryo (chorion) and the inner wall of the womb (uterus). This condition can cause vaginal bleeding. If they cause little or no vaginal bleeding, early small hematomas usually shrink on their own and do not affect your baby or pregnancy. When bleeding starts later in pregnancy, or if the hematoma is larger or occurs in older pregnant women, the condition may be more serious. Larger  hematomas may get bigger, which increases the chances of miscarriage. This condition also increases the risk of:  Premature separation of the placenta from the uterus.  Premature (preterm) labor.  Stillbirth. What are the causes? The exact cause of this condition is not known. It occurs when blood is trapped between the placenta and the uterine wall because the placenta has separated from the original site of implantation. What increases the risk? You are more likely to develop this condition if:  You were treated with fertility medicines.  You conceived through in vitro fertilization (IVF). What are the signs or symptoms? Symptoms of this condition include:  Vaginal spotting or bleeding.  Contractions of the uterus. These cause abdominal pain. Sometimes you may have no symptoms and the bleeding may only be seen when ultrasound images are taken (transvaginal ultrasound). How is this diagnosed? This condition is diagnosed based on a physical exam. This includes a pelvic exam. You may also have other tests, including:  Blood tests.  Urine tests.  Ultrasound of the abdomen. How is this treated? Treatment for this condition can vary. Treatment may include:  Watchful waiting. You will be monitored closely for any changes in bleeding. During this stage: ? The hematoma may be reabsorbed by the body. ? The hematoma may separate the fluid-filled space containing the embryo (gestational sac) from the wall of the womb (endometrium).  Medicines.  Activity restriction. This may be needed until the bleeding stops. Follow these instructions at home:  Stay on bed rest if told to do so by your health care provider.  Do not lift anything that is heavier than 10 lbs. (4.5 kg) or as told by your health care provider.  Do not use any products that contain nicotine or tobacco, such as cigarettes and e-cigarettes. If you need help quitting, ask your health care provider.  Track and write  down the number of pads you use each day and how soaked (saturated) they are.  Do not use tampons.  Keep all follow-up visits as told by your health care provider. This is important. Your health care provider may ask you to  have follow-up blood tests or ultrasound tests or both. Contact a health care provider if:  You have any vaginal bleeding.  You have a fever. Get help right away if:  You have severe cramps in your stomach, back, abdomen, or pelvis.  You pass large clots or tissue. Save any tissue for your health care provider to look at.  You have more vaginal bleeding, and you faint or become lightheaded or weak. Summary  A subchorionic hematoma is a gathering of blood between the outer wall of the placenta and the uterus.  This condition can cause vaginal bleeding.  Sometimes you may have no symptoms and the bleeding may only be seen when ultrasound images are taken.  Treatment may include watchful waiting, medicines, or activity restriction. This information is not intended to replace advice given to you by your health care provider. Make sure you discuss any questions you have with your health care provider. Document Released: 09/05/2006 Document Revised: 05/03/2017 Document Reviewed: 07/17/2016 Elsevier Patient Education  2020 [employer]

## 2019-01-13 LAB — GC/CHLAMYDIA PROBE AMP (~~LOC~~) NOT AT ARMC
Chlamydia: NEGATIVE
Neisseria Gonorrhea: NEGATIVE

## 2019-02-05 LAB — OB RESULTS CONSOLE RUBELLA ANTIBODY, IGM: Rubella: IMMUNE

## 2019-02-05 LAB — OB RESULTS CONSOLE HEPATITIS B SURFACE ANTIGEN: Hepatitis B Surface Ag: NEGATIVE

## 2019-02-05 LAB — OB RESULTS CONSOLE RPR: RPR: NONREACTIVE

## 2019-02-05 LAB — OB RESULTS CONSOLE VARICELLA ZOSTER ANTIBODY, IGG: Varicella: IMMUNE

## 2019-02-05 LAB — OB RESULTS CONSOLE GC/CHLAMYDIA
Chlamydia: NEGATIVE
Gonorrhea: NEGATIVE

## 2019-02-05 LAB — HIV ANTIBODY (ROUTINE TESTING W REFLEX): HIV Screen 4th Generation wRfx: NONREACTIVE

## 2019-05-25 LAB — OB RESULTS CONSOLE RPR: RPR: NONREACTIVE

## 2019-06-05 NOTE — L&D Delivery Note (Signed)
Operative Delivery Note At 6:33 AM on 08/26/19 a viable female "Vinetta Bergamo" was delivered via Vaginal, Spontaneous.  Presentation: vertex; Position: Left,, Occiput,, Anterior; Station: +5.  Delivery of the head: 08/26/2019  6:32 AM First maneuver: 08/26/2019  6:32 AM, McRoberts Second maneuver: 08/26/2019  6:32 AM, Suprapubic Pressure Third maneuver: 08/26/2019  6:32 AM,  Posterior arm  Fourth maneuver: 08/26/2019  6:32 AM,  Joseph Art screw    Fifth maneuver: 08/26/2019  6:32 AM, McRoberts and Suprapubic pressure APGAR: 8, 9; weight pending.    Reece Galvan 24 y.o. G2P0010 at [redacted]w[redacted]d admitted for labor, on admit she was 6cm, then had sponatenous rupture of membranes, progressed to 7cm and then required pitocin for augmentation and then progressed normally. Received an epidural for pain management. Pushed for 19 minutes. No nuchal cord. Delivery complicated by 1 minute shoulder dystocia as documented above. Baby placed on maternal abdomen. Delayed cord clamping for 60 seconds. Delivery of placenta was spontaneous. Placenta was found to be intact 3 -vessel cord was noted. The fundus was found to be firm and the lower uterine segmant was cleared. Left labial laceration hemostatic without repair. Estimated blood loss 150cc. Cord gases sent. Instrument and gauze counts were correct at the end of the procedure. Placenta status: to L&D   Cord: Cord pH: 7.26, pCO2 57, Bicarb 24.7 Mom to postpartum.  Baby to Couplet care / Skin to Skin.  Terrill Wauters K Taam-Akelman 08/26/2019, 6:56 AM

## 2019-08-06 LAB — OB RESULTS CONSOLE GBS: GBS: NEGATIVE

## 2019-08-25 ENCOUNTER — Inpatient Hospital Stay (EMERGENCY_DEPARTMENT_HOSPITAL)
Admission: AD | Admit: 2019-08-25 | Discharge: 2019-08-25 | Disposition: A | Discharge status: Home / Self Care | Payer: Medicaid Other | Source: Home / Self Care | Attending: Obstetrics & Gynecology | Admitting: Obstetrics & Gynecology

## 2019-08-25 ENCOUNTER — Encounter (HOSPITAL_COMMUNITY): Payer: Self-pay | Admitting: Obstetrics

## 2019-08-25 ENCOUNTER — Other Ambulatory Visit: Payer: Self-pay

## 2019-08-25 DIAGNOSIS — O471 False labor at or after 37 completed weeks of gestation: Secondary | ICD-10-CM | POA: Insufficient documentation

## 2019-08-25 DIAGNOSIS — Z3A39 39 weeks gestation of pregnancy: Secondary | ICD-10-CM

## 2019-08-25 DIAGNOSIS — O479 False labor, unspecified: Secondary | ICD-10-CM

## 2019-08-25 DIAGNOSIS — Z3689 Encounter for other specified antenatal screening: Secondary | ICD-10-CM

## 2019-08-25 NOTE — Discharge Instructions (Signed)

## 2019-08-25 NOTE — MAU Note (Signed)
Pt has been having contractions since last night around 8pm.  They have been intermittent and varying in intensity. Rating pain 7/10, 5-10 mins apart. Saw pink discharge this morning. Denies LOF. +FM

## 2019-08-25 NOTE — MAU Provider Note (Signed)
S: Ms. Joann Price is a 24 y.o. G2P0010 at [redacted]w[redacted]d  who presents to MAU today for labor evaluation.     Cervical exam by RN:  Dilation: 2 Effacement (%): 60 Station: -1 Presentation: Vertex Exam by:: DCALLAWAY, RN No change over time  Fetal Monitoring: Baseline: 140 Variability: average Accelerations: present Decelerations: absent Contractions: irregular  MDM Discussed patient with RN. NST reviewed.   A: SIUP at [redacted]w[redacted]d  False labor  P: Discharge home Labor precautions and kick counts included in AVS Patient to follow-up with office as scheduled  Patient may return to MAU as needed or when in labor   Valora Piccolo 08/25/2019 8:45 PM

## 2019-08-25 NOTE — MAU Note (Signed)
DENIES HSV  OUTBREAK DURING THIS  PREG . DENIES ANY S/S OF AN OUTBREAK NOW

## 2019-08-26 ENCOUNTER — Inpatient Hospital Stay (HOSPITAL_COMMUNITY): Payer: Medicaid Other | Admitting: Anesthesiology

## 2019-08-26 ENCOUNTER — Encounter (HOSPITAL_COMMUNITY): Payer: Self-pay | Admitting: Obstetrics

## 2019-08-26 ENCOUNTER — Inpatient Hospital Stay (HOSPITAL_COMMUNITY)
Admission: AD | Admit: 2019-08-26 | Discharge: 2019-08-27 | DRG: 806 | Disposition: A | Discharge status: Home / Self Care | Payer: Medicaid Other | Attending: Obstetrics & Gynecology | Admitting: Obstetrics & Gynecology

## 2019-08-26 DIAGNOSIS — A6000 Herpesviral infection of urogenital system, unspecified: Secondary | ICD-10-CM | POA: Diagnosis present

## 2019-08-26 DIAGNOSIS — O9832 Other infections with a predominantly sexual mode of transmission complicating childbirth: Secondary | ICD-10-CM | POA: Diagnosis present

## 2019-08-26 DIAGNOSIS — O99324 Drug use complicating childbirth: Secondary | ICD-10-CM | POA: Diagnosis present

## 2019-08-26 DIAGNOSIS — Z3A39 39 weeks gestation of pregnancy: Secondary | ICD-10-CM

## 2019-08-26 DIAGNOSIS — Z20822 Contact with and (suspected) exposure to covid-19: Secondary | ICD-10-CM | POA: Diagnosis present

## 2019-08-26 DIAGNOSIS — O26893 Other specified pregnancy related conditions, third trimester: Secondary | ICD-10-CM | POA: Diagnosis present

## 2019-08-26 DIAGNOSIS — Z87891 Personal history of nicotine dependence: Secondary | ICD-10-CM | POA: Diagnosis not present

## 2019-08-26 DIAGNOSIS — F1210 Cannabis abuse, uncomplicated: Secondary | ICD-10-CM | POA: Diagnosis present

## 2019-08-26 LAB — RAPID URINE DRUG SCREEN, HOSP PERFORMED
Amphetamines: NOT DETECTED
Barbiturates: NOT DETECTED
Benzodiazepines: NOT DETECTED
Cocaine: NOT DETECTED
Opiates: NOT DETECTED
Tetrahydrocannabinol: POSITIVE — AB

## 2019-08-26 LAB — RPR: RPR Ser Ql: NONREACTIVE

## 2019-08-26 LAB — CBC
HCT: 38.1 % (ref 36.0–46.0)
Hemoglobin: 13.2 g/dL (ref 12.0–15.0)
MCH: 32.3 pg (ref 26.0–34.0)
MCHC: 34.6 g/dL (ref 30.0–36.0)
MCV: 93.2 fL (ref 80.0–100.0)
Platelets: 253 10*3/uL (ref 150–400)
RBC: 4.09 MIL/uL (ref 3.87–5.11)
RDW: 11.9 % (ref 11.5–15.5)
WBC: 13.6 10*3/uL — ABNORMAL HIGH (ref 4.0–10.5)
nRBC: 0 % (ref 0.0–0.2)

## 2019-08-26 LAB — TYPE AND SCREEN
ABO/RH(D): B POS
Antibody Screen: NEGATIVE

## 2019-08-26 LAB — RESPIRATORY PANEL BY RT PCR (FLU A&B, COVID)
Influenza A by PCR: NEGATIVE
Influenza B by PCR: NEGATIVE
SARS Coronavirus 2 by RT PCR: NEGATIVE

## 2019-08-26 LAB — HIV ANTIBODY (ROUTINE TESTING W REFLEX): HIV Screen 4th Generation wRfx: NONREACTIVE

## 2019-08-26 MED ORDER — BENZOCAINE-MENTHOL 20-0.5 % EX AERO
1.0000 "application " | INHALATION_SPRAY | CUTANEOUS | Status: DC | PRN
  Administered 2019-08-26: 1 via TOPICAL
  Filled 2019-08-26: qty 56

## 2019-08-26 MED ORDER — OXYCODONE HCL 5 MG PO TABS: 10.0000 mg | ORAL_TABLET | ORAL | Status: DC | PRN

## 2019-08-26 MED ORDER — PHENYLEPHRINE 40 MCG/ML (10ML) SYRINGE FOR IV PUSH (FOR BLOOD PRESSURE SUPPORT): 80.0000 ug | PREFILLED_SYRINGE | INTRAVENOUS | Status: DC | PRN

## 2019-08-26 MED ORDER — TERBUTALINE SULFATE 1 MG/ML IJ SOLN: 0.2500 mg | Freq: Once | INTRAMUSCULAR | Status: DC | PRN

## 2019-08-26 MED ORDER — LIDOCAINE HCL (PF) 1 % IJ SOLN: 30.0000 mL | INTRAMUSCULAR | Status: DC | PRN

## 2019-08-26 MED ORDER — DIPHENHYDRAMINE HCL 25 MG PO CAPS: 25.0000 mg | ORAL_CAPSULE | Freq: Four times a day (QID) | ORAL | Status: DC | PRN

## 2019-08-26 MED ORDER — COCONUT OIL OIL: 1.0000 "application " | TOPICAL_OIL | Status: DC | PRN

## 2019-08-26 MED ORDER — ONDANSETRON HCL 4 MG/2ML IJ SOLN: 4.0000 mg | Freq: Four times a day (QID) | INTRAMUSCULAR | Status: DC | PRN

## 2019-08-26 MED ORDER — SODIUM CHLORIDE (PF) 0.9 % IJ SOLN
INTRAMUSCULAR | Status: DC | PRN
  Administered 2019-08-26: 12 mL/h via EPIDURAL

## 2019-08-26 MED ORDER — PRENATAL MULTIVITAMIN CH
1.0000 | ORAL_TABLET | Freq: Every day | ORAL | Status: DC
  Administered 2019-08-26 – 2019-08-27 (×2): 1 via ORAL
  Filled 2019-08-26 (×2): qty 1

## 2019-08-26 MED ORDER — ONDANSETRON HCL 4 MG/2ML IJ SOLN: 4.0000 mg | INTRAMUSCULAR | Status: DC | PRN

## 2019-08-26 MED ORDER — OXYTOCIN 40 UNITS IN NORMAL SALINE INFUSION - SIMPLE MED
1.0000 m[IU]/min | INTRAVENOUS | Status: DC
  Administered 2019-08-26: 2 m[IU]/min via INTRAVENOUS

## 2019-08-26 MED ORDER — DIPHENHYDRAMINE HCL 50 MG/ML IJ SOLN: 12.5000 mg | INTRAMUSCULAR | Status: DC | PRN

## 2019-08-26 MED ORDER — ACETAMINOPHEN 325 MG PO TABS: 650.0000 mg | ORAL_TABLET | ORAL | Status: DC | PRN

## 2019-08-26 MED ORDER — WITCH HAZEL-GLYCERIN EX PADS: 1.0000 "application " | MEDICATED_PAD | CUTANEOUS | Status: DC | PRN

## 2019-08-26 MED ORDER — ONDANSETRON HCL 4 MG PO TABS: 4.0000 mg | ORAL_TABLET | ORAL | Status: DC | PRN

## 2019-08-26 MED ORDER — LACTATED RINGERS IV SOLN: 500.0000 mL | Freq: Once | INTRAVENOUS | Status: DC

## 2019-08-26 MED ORDER — OXYCODONE HCL 5 MG PO TABS: 5.0000 mg | ORAL_TABLET | ORAL | Status: DC | PRN

## 2019-08-26 MED ORDER — LIDOCAINE-EPINEPHRINE (PF) 2 %-1:200000 IJ SOLN
INTRAMUSCULAR | Status: DC | PRN
  Administered 2019-08-26: 3 mL via EPIDURAL

## 2019-08-26 MED ORDER — EPHEDRINE 5 MG/ML INJ: 10.0000 mg | INTRAVENOUS | Status: DC | PRN

## 2019-08-26 MED ORDER — LACTATED RINGERS IV SOLN: 500.0000 mL | INTRAVENOUS | Status: DC | PRN

## 2019-08-26 MED ORDER — OXYTOCIN BOLUS FROM INFUSION
500.0000 mL | Freq: Once | INTRAVENOUS | Status: AC
  Administered 2019-08-26: 07:00:00 500 mL via INTRAVENOUS

## 2019-08-26 MED ORDER — ACETAMINOPHEN 325 MG PO TABS
650.0000 mg | ORAL_TABLET | ORAL | Status: DC | PRN
  Administered 2019-08-26: 650 mg via ORAL
  Filled 2019-08-26: qty 2

## 2019-08-26 MED ORDER — TETANUS-DIPHTH-ACELL PERTUSSIS 5-2.5-18.5 LF-MCG/0.5 IM SUSP: 0.5000 mL | Freq: Once | INTRAMUSCULAR | Status: DC

## 2019-08-26 MED ORDER — SOD CITRATE-CITRIC ACID 500-334 MG/5ML PO SOLN: 30.0000 mL | ORAL | Status: DC | PRN

## 2019-08-26 MED ORDER — DOCUSATE SODIUM 100 MG PO CAPS
100.0000 mg | ORAL_CAPSULE | Freq: Two times a day (BID) | ORAL | Status: DC
  Administered 2019-08-26 – 2019-08-27 (×2): 100 mg via ORAL
  Filled 2019-08-26 (×2): qty 1

## 2019-08-26 MED ORDER — IBUPROFEN 600 MG PO TABS
600.0000 mg | ORAL_TABLET | Freq: Four times a day (QID) | ORAL | Status: DC
  Administered 2019-08-26 – 2019-08-27 (×5): 600 mg via ORAL
  Filled 2019-08-26 (×6): qty 1

## 2019-08-26 MED ORDER — SODIUM CHLORIDE 0.9% FLUSH
3.0000 mL | Freq: Two times a day (BID) | INTRAVENOUS | Status: DC
  Administered 2019-08-26: 3 mL via INTRAVENOUS

## 2019-08-26 MED ORDER — SIMETHICONE 80 MG PO CHEW: 80.0000 mg | CHEWABLE_TABLET | ORAL | Status: DC | PRN

## 2019-08-26 MED ORDER — LACTATED RINGERS IV SOLN: INTRAVENOUS | Status: DC

## 2019-08-26 MED ORDER — SODIUM CHLORIDE 0.9 % IV SOLN: 250.0000 mL | INTRAVENOUS | Status: DC | PRN

## 2019-08-26 MED ORDER — FENTANYL-BUPIVACAINE-NACL 0.5-0.125-0.9 MG/250ML-% EP SOLN
12.0000 mL/h | EPIDURAL | Status: DC | PRN
  Filled 2019-08-26: qty 250

## 2019-08-26 MED ORDER — DIBUCAINE (PERIANAL) 1 % EX OINT: 1.0000 "application " | TOPICAL_OINTMENT | CUTANEOUS | Status: DC | PRN

## 2019-08-26 MED ORDER — INFLUENZA VAC SPLIT QUAD 0.5 ML IM SUSY: 0.5000 mL | PREFILLED_SYRINGE | INTRAMUSCULAR | Status: DC

## 2019-08-26 MED ORDER — SODIUM CHLORIDE 0.9% FLUSH: 3.0000 mL | INTRAVENOUS | Status: DC | PRN

## 2019-08-26 MED ORDER — OXYTOCIN 40 UNITS IN NORMAL SALINE INFUSION - SIMPLE MED
2.5000 [IU]/h | INTRAVENOUS | Status: DC
  Administered 2019-08-26: 07:00:00 2.5 [IU]/h via INTRAVENOUS
  Filled 2019-08-26: qty 1000

## 2019-08-26 MED ORDER — BUPIVACAINE HCL (PF) 0.25 % IJ SOLN
INTRAMUSCULAR | Status: DC | PRN
  Administered 2019-08-26 (×2): 4 mL via EPIDURAL

## 2019-08-26 NOTE — H&P (Signed)
Joann Price is a 24 y.o. female G2P0010 [redacted]w[redacted]d presenting for labor. Evaluated in MAU earlier today, was 1 to 2cm, now 6cm/70/-1. She reports no LOF, VB. Reports regular contractions. Normal FM.   Pregnancy c/b: 1. Substance abuse: Previous meth, heroine use, reported quit when she found out she was pregnant. First trimester + Cannaboids. UDS 2nd trimester +cannabis and +cocaine (04/14/2019), +Cannabis (05/25/2019) 2. HCV antibody positive, follow up quant not detected (04/07/2019), likely false positive or cleared infection 3. 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 Choroid plexus cyst: On anatomy scan, isolated finding and NIPT was negative  OB History    Gravida  2   Para      Term      Preterm      AB  1   Living        SAB      TAB  1   Ectopic      Multiple      Live Births             Past Medical History:  Diagnosis Date  . ADHD   . Anxiety   . Bipolar 1 disorder (HCC)   . Depression   . PTSD (post-traumatic stress disorder)   . Substance abuse (HCC)    History reviewed. No pertinent surgical history. Family History: family history includes CAD in an other family member. Social History:  reports that she quit smoking about 7 months ago. Her smoking use included cigarettes. She has never used smokeless tobacco. She reports current drug use. Drugs: Methamphetamines and Marijuana. She reports that she does not drink alcohol.     Maternal Diabetes: No Genetic Screening: Normal - NIPT Maternal Ultrasounds/Referrals: Isolated choroid plexus cyst. EFW [redacted]w[redacted]d, 2632g (20.2%), AC 28.7%. Anterior placenta Fetal Ultrasounds or other Referrals:  None Maternal Substance Abuse:  Yes:  Type: Marijuana, Cocaine. History of heroine and methamphetamine, negative during pregnancy Significant Maternal Medications:  None Significant Maternal Lab Results:  Group B Strep negative and Other: Hep C antibody positive, quant not detected 04/07/2019 Other Comments:  None  Review of Systems Per  HPI Exam Physical Exam  Dilation: 6 Effacement (%): 70 Station: -1 Exam by:: D.Callaway RN  Blood pressure 94/78, pulse 99, resp. rate 18, SpO2 97 %.  Vitals:   08/26/19 0251 08/26/19 0256 08/26/19 0301 08/26/19 0307  BP: 115/68 104/89 121/77 94/78  Pulse: 83 86 89 99  Resp: 18 18 18 18   SpO2: 97% 97% 97%     NAD, resting comfortably Gravid abdomen Fetal testing: FHR 125, +accels, no decels, Cat 1. Toco q68m Prenatal labs: ABO, Rh:  --/--/B POS (03/24 0135) Antibody: NEG (03/24 0135) Rubella: Immune (09/03 0000) RPR: Nonreactive (12/21 0000)  HBsAg: Negative (09/03 0000)  HIV: nonreactive (09/03 0000)  GBS: Negative/-- (03/04 0000)   Assessment/Plan: Joann Price 23 y.o. G2P0010 at [redacted]w[redacted]d with labor 1. Labor: on admit 6/70/-1, on L&D she SROM'ed now is 7/100/-1, will augment with pitocin prn. Epidural in place. GBS neg.  2. Substance abuse: Previous meth, heroine used, reported quit when she found out she was pregnant. First trimester + Cannaboids. UDS 2nd trimester +cannabis and +cocaine (04/14/2019), +Cannabis (05/25/2019). Has previously gone to [facility] rehab. Referred to counseling during pregnancy, patient did not establish -UDS ordered on admit -Patient attended majority of prenatal visits but often rescheduled last minute due to social situation. Reported moving back and forth with boyfriend's family, taking care of his son.  -Plan for social work consult postpartum 3. HCV antibody positive,  follow up quant not detected (04/07/2019), likely false positive or cleared infection -Repeat HCV and HIV ordered on admit 4. 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 No outbreaks and no lesions on admit  Yaseen Gilberg K Taam-Akelman 08/26/2019, 3:32 AM

## 2019-08-26 NOTE — Anesthesia Preprocedure Evaluation (Signed)
Anesthesia Evaluation  Patient identified by MRN, date of birth, ID band Patient awake    Reviewed: Allergy & Precautions, Patient's Chart, lab work & pertinent test results  History of Anesthesia Complications Negative for: history of anesthetic complications  Airway Mallampati: I  TM Distance: >3 FB Neck ROM: Full    Dental  (+) Dental Advisory Given   Pulmonary neg pulmonary ROS, neg recent URI, former smoker,    breath sounds clear to auscultation       Cardiovascular negative cardio ROS   Rhythm:Regular     Neuro/Psych PSYCHIATRIC DISORDERS Anxiety Depression Bipolar Disorder negative neurological ROS     GI/Hepatic negative GI ROS, Neg liver ROS,   Endo/Other  negative endocrine ROS  Renal/GU negative Renal ROS     Musculoskeletal negative musculoskeletal ROS (+)   Abdominal   Peds  Hematology negative hematology ROS (+) plt 253    Anesthesia Other Findings   Reproductive/Obstetrics (+) Pregnancy                             Anesthesia Physical Anesthesia Plan  ASA: II  Anesthesia Plan: Epidural   Post-op Pain Management:    Induction:   PONV Risk Score and Plan: 2 and Treatment may vary due to age or medical condition  Airway Management Planned:   Additional Equipment:   Intra-op Plan:   Post-operative Plan:   Informed Consent: I have reviewed the patients History and Physical, chart, labs and discussed the procedure including the risks, benefits and alternatives for the proposed anesthesia with the patient or authorized representative who has indicated his/her understanding and acceptance.       Plan Discussed with:   Anesthesia Plan Comments:         Anesthesia Quick Evaluation

## 2019-08-26 NOTE — Anesthesia Postprocedure Evaluation (Signed)
Anesthesia Post Note  Patient: [name]  Procedure(s) Performed: AN AD HOC LABOR EPIDURAL     Patient location during evaluation: Mother Baby Anesthesia Type: Epidural Level of consciousness: awake and alert Pain management: pain level controlled Vital Signs Assessment: post-procedure vital signs reviewed and stable Respiratory status: spontaneous breathing, nonlabored ventilation and respiratory function stable Cardiovascular status: stable Postop Assessment: no headache, no backache and epidural receding Anesthetic complications: no    Last Vitals:  Vitals:   08/26/19 1000 08/26/19 1445  BP: 102/84 116/77  Pulse: 74 75  Resp: 18 18  Temp: 36.5 C 36.7 C  SpO2:      Last Pain:  Vitals:   08/26/19 1445  TempSrc: Axillary  PainSc:    Pain Goal:                   Joann Price

## 2019-08-26 NOTE — Lactation Note (Signed)
This note was copied from a baby's chart. Lactation Consultation Note  Patient Name: Joann Price [id number] Date: 08/26/2019 Reason for consult: Initial assessment;Term;Primapara;1st time breastfeeding;Infant < 6lbs  P1 mother whose infant is now 41 hours old.  This is a term baby at 39+5 weeks weighing < 6 lbs.  Mother has a history of poly substance abuse.  RN has done some initial teaching and consultation.  Mother is familiar with hand expression and was able to express 5 mls of colostrum which was fed back to baby.  Offered to initiate the DEBP to help with mother's milk supply and to provide EBM for supplementation.  Mother willing to pump.  Suggested father do STS while mother pumps.  Father receptive to this plan.  Mother's breasts are soft and non tender and nipples are everted and intact.  Pump parts, assembly, disassembly and cleaning reviewed.  Mother did a return demonstration of pump assembly.  Observed her pumping and the #24 flange size is appropriate at this time.  Demonstrated breast compressions during pumping.  Mother has a :"hands free" bra at home and will use this after discharge.  Observed her pumping while discussing breast feeding basics.  Encouraged a lot of STS and to awaken baby at least every three hours if she does not self awaken due to her weight.  Mother verbalized understanding.  Suggested mother call her RN/LC for latch assistance as desired.  Mom made aware of O/P services, breastfeeding support groups, community resources, and our phone # for post-discharge questions.  Mother has an Evenflo DEBP for home use.  She is also interested in applying for [facility].  Suggested she call the [facility] office today and set up an appointment.  Briefly explained our [facility] loaner program as an option if needed.  Mother will do this.     Maternal Data Formula Feeding for Exclusion: No Has patient been taught Hand Expression?: Yes Does the patient have breastfeeding experience prior to  this delivery?: No  Feeding Feeding Type: Breast Milk  LATCH Score Latch: Grasps breast easily, tongue down, lips flanged, rhythmical sucking.  Audible Swallowing: A few with stimulation  Type of Nipple: Everted at rest and after stimulation  Comfort (Breast/Nipple): Soft / non-tender  Hold (Positioning): Full assist, staff holds infant at breast  LATCH Score: 7  Interventions    Lactation Tools Discussed/Used Tools: Pump WIC Program: Yes Pump Review: Setup, frequency, and cleaning;Milk Storage Initiated by:: Laureen Ochs Date initiated:: 08/26/19   Consult Status Consult Status: Follow-up Date: 08/27/19 Follow-up type: In-patient    Dray Dente R Melodie Ashworth 08/26/2019, 12:09 PM

## 2019-08-26 NOTE — Anesthesia Procedure Notes (Signed)
Epidural Patient location during procedure: floor Start time: 08/26/2019 2:25 AM End time: 08/26/2019 2:43 AM  Staffing Anesthesiologist: Val Eagle, MD Performed: anesthesiologist   Preanesthetic Checklist Completed: patient identified, IV checked, risks and benefits discussed, surgical consent, monitors and equipment checked, pre-op evaluation and timeout performed  Epidural Patient position: sitting Prep: DuraPrep Patient monitoring: heart rate, continuous pulse ox and blood pressure Approach: midline Location: L3-L4 Injection technique: LOR saline  Needle:  Needle type: Tuohy  Needle gauge: 17 G Needle length: 9 cm Needle insertion depth: 7 cm Catheter type: closed end flexible Catheter size: 19 Gauge Catheter at skin depth: 12 cm Test dose: negative and 2% lidocaine with Epi 1:200 K  Additional Notes Reason for block:at surgeon's request and procedure for pain

## 2019-08-27 LAB — CBC
HCT: 36.5 % (ref 36.0–46.0)
Hemoglobin: 12.2 g/dL (ref 12.0–15.0)
MCH: 32.4 pg (ref 26.0–34.0)
MCHC: 33.4 g/dL (ref 30.0–36.0)
MCV: 97.1 fL (ref 80.0–100.0)
Platelets: 237 10*3/uL (ref 150–400)
RBC: 3.76 MIL/uL — ABNORMAL LOW (ref 3.87–5.11)
RDW: 12.4 % (ref 11.5–15.5)
WBC: 11.4 10*3/uL — ABNORMAL HIGH (ref 4.0–10.5)
nRBC: 0 % (ref 0.0–0.2)

## 2019-08-27 NOTE — Discharge Summary (Signed)
Obstetric Discharge Summary Reason for Admission: onset of labor Prenatal Procedures: none Intrapartum Procedures: spontaneous vaginal delivery Postpartum Procedures: none Complications-Operative and Postpartum: none Hemoglobin  Date Value Ref Range Status  08/27/2019 12.2 12.0 - 15.0 g/dL Final   HCT  Date Value Ref Range Status  08/27/2019 36.5 36.0 - 46.0 % Final    Discharge Diagnoses: Term Pregnancy-delivered  Discharge Information: Date: 08/27/2019 Activity: pelvic rest Diet: routine Medications: Ibuprofen Condition: stable Instructions: refer to practice specific booklet Discharge to: home Follow-up Information    Taam-Akelman, Griselda Miner, MD Follow up in 4 week(s).   Specialty: Obstetrics and Gynecology Contact information: [address] [place] [address] [phone]           Newborn Data: Live born female  Birth Weight: 5 lb 12.8 oz (2630 g) APGAR: 8, 9  Newborn Delivery   Birth date/time: 08/26/2019 06:33:00 Delivery type: Vaginal, Spontaneous      Home with mother.  Loney Laurence 08/27/2019, 7:51 AM

## 2019-08-27 NOTE — Progress Notes (Signed)
Patient is eating, ambulating, voiding.  Pain control is good.  Vitals:   08/26/19 1000 08/26/19 1445 08/26/19 1831 08/26/19 2230  BP: 102/84 116/77 119/83 117/78  Pulse: 74 75 81 76  Resp: 18 18 18 18   Temp: 97.7 F (36.5 C) 98.1 F (36.7 C) 98.1 F (36.7 C) 98.7 F (37.1 C)  TempSrc: Axillary Axillary Axillary Oral  SpO2:    97%  Weight:      Height:        Fundus firm Perineum without swelling.  Lab Results  Component Value Date   WBC 11.4 (H) 08/27/2019   HGB 12.2 08/27/2019   HCT 36.5 08/27/2019   MCV 97.1 08/27/2019   PLT 237 08/27/2019    --/--/B POS (03/24 0135)/RI  A/P Post partum day 1.  Routine care.  Expect d/c routine.    11-13-1986

## 2019-08-27 NOTE — Clinical Social Work Maternal (Signed)
CLINICAL SOCIAL WORK MATERNAL/CHILD NOTE  Patient Details  Name: Vertis Kelch MRN: [id number] Date of Birth: 07/24/1995  Date:  08/10/2019  Clinical Social Worker Initiating Note:  Elijio Miles Date/Time: Initiated:  08/27/19/0942     Child's Name:  [facility]   Biological Parents:  Mother, Father([place] DOB: 01/13/1991)   Need for Interpreter:  None   Reason for Referral:  Behavioral Health Concerns, Current Substance Use/Substance Use During Pregnancy    Address:  [address] [place] [address] (will be moving into [address] [place] [address])   Phone number:  [phone] (home)     Additional phone number:   Household Members/Support Persons (HM/SP):   Household Member/Support Person 1, Household Member/Support Person 2   HM/SP Name Relationship DOB or Age  HM/SP -[place] FOB 01/13/1991  HM/SP -2 Idelle Crouch    HM/SP -3        HM/SP -4        HM/SP -5        HM/SP -6        HM/SP -7        HM/SP -8          Natural Supports (not living in the home):  Parent, Extended Family   Professional Supports: None   Employment: Unemployed   Type of Work:     Education:  Programmer, systems   Homebound arranged:    Museum/gallery curator Resources:  [employer]   Other Resources:  Physicist, medical (Intends to apply for [facility])   Cultural/Religious Considerations Which May Impact Care:    Strengths:  Ability to meet basic needs , Home prepared for child    Psychotropic Medications:         Pediatrician:       Pediatrician List:   [place]      Pediatrician Fax Number:    Risk Factors/Current Problems:  Mental Health Concerns , Substance Use    Cognitive State:  Able to Concentrate , Alert , Linear Thinking    Mood/Affect:  Calm , Comfortable , Happy , Interested , Relaxed    CSW Assessment:  CSW received consult for  history of anxiety and depression and substance use during pregnancy. CSW met with MOB to offer support and complete assessment.    MOB sitting up in bed with infant asleep in bassinet and FOB present at bedside, when CSW entered the room. CSW introduced self and received verbal permission to have FOB step out of the room so that CSW could meet with MOB in private. FOB understanding and left voluntarily. MOB pleasant and easy to engage throughout visit. Per MOB, she and FOB currently live with her aunt Jana Half but will be moving into the apartment next door. MOB reported she is currently receiving unemployment and receives food stamps. MOB stated she is not currently receiving WIC but would like to apply. MOB provided with [facility] information to call and set up appointment. CSW inquired about MOB's mental health history to which MOB acknowledged a history of bipolar disorder, anxiety and ADD. MOB reported her bipolar disorder is what she feels she is most effected by. MOB denied any recent counseling or medications but reported being interested in starting counseling. MOB shared experiencing some symptoms during her pregnancy such as "breakdowns" but reported it was nothing that wasn't manageable. MOB reported primary  coping skill is taking time for herself. CSW to provide MOB with list of counseling resources to follow up with after discharge. CSW also provided education regarding the baby blues period vs. perinatal mood disorders, discussed treatment and gave resources for mental health follow up if concerns arise. CSW recommended self-evaluation during the postpartum time period using the New Mom Checklist from Postpartum Progress and encouraged MOB to contact a medical professional if symptoms are noted at any time. MOB did not appear to be displaying any acute mental health symptoms and denied any current SI, HI or DV. MOB reported feeling well supported by her mother, father and her aunt. MOB confirmed having all  essential items for infant once discharged and stated infant would be sleeping in a bassinet once home. CSW provided review of Sudden Infant Death Syndrome (SIDS) precautions and safe sleeping habits.    CSW inquired about MOB's substance use history to which MOB initially acknowledged use of marijuana during her pregnancy. MOB also openly disclosed that prior to finding out she was pregnant, MOB used meth and heroin heavily but that when she found out she was pregnant she quit using both. MOB denied participating in any programs to help quit and was able to do it on her own. MOB stated she does not want to go back to using meth and heroin as she likes who she is without it. MOB did report daily use of marijuana to help cope, help with her appetite and help with her mood. MOB reported she felt it was better to use marijuana than the other drugs. CSW addressed positive UDS during MOB's second trimester that was positive for cocaine as well. Per MOB, FOB had brought it home and she gave into temptation. CSW inquired about if FOB was actively using substances to which MOB denied and stated that he now only smokes marijuana. CSW asked if there were concerns that FOB may have a negative impact on her recovery to which MOB stated she couldn't deny that there was some concern but that FOB's family is supportive of his recovery and are holding him accountable. MOB reported she and FOB also utilize the N/A book. MOB denied any substances currently being in the home and reported last use of marijuana was a couple days prior to delivery. MOB open and receptive to substance use resources. CSW informed MOB of [place] and explained UDS came back positive for [facility] and that CDS was still pending. CSW explained that due to infant's positive UDS for [facility] that CSW would be making [facility] CPS report. MOB denied any questions or concerns regarding CPS report.  CSW made [facility] CPS report due to  infant's positive UDS for THC.    CSW Plan/Description:  No Further Intervention Required/No Barriers to Discharge, Sudden Infant Death Syndrome (SIDS) Education, Perinatal Mood and Anxiety Disorder (PMADs) Education, [place], Other Information/Referral to [employer], Child Protective Service Report , CSW Will Continue to Monitor Umbilical Cord Tissue Drug Screen Results and Make Report if Warranted    Noah Delaine [place], [place] September 22, 2019, 10:21 AM

## 2019-08-27 NOTE — Progress Notes (Signed)
AVS printed and pt discharged home with dad in room. Pt informed to call for follow up appointment. All questions answered and pt verbalized understanding.

## 2019-08-28 ENCOUNTER — Ambulatory Visit: Payer: Self-pay

## 2019-08-28 LAB — HCV RT-PCR, QUANT (NON-GRAPH): Hepatitis C Quantitation: NOT DETECTED IU/mL

## 2019-08-28 LAB — HCV AB W REFLEX TO QUANT PCR: HCV Ab: 10.8 s/co ratio — ABNORMAL HIGH (ref 0.0–0.9)

## 2019-08-28 NOTE — Lactation Note (Signed)
This note was copied from a baby's chart. Lactation Consultation Note  Patient Name: Joann Price [id number] Date: 08/28/2019 Reason for consult: Follow-up assessment;Difficult latch;Primapara;Infant < 6lbs;Term  LC in to visit with P1 Mom of term baby on day of discharge.  Baby 50 hrs old and at 6% weight loss, a loss of 9 gm from yesterday.  Baby's output good.  Bilirubin below light level.  Baby breastfed with some formula supplementation until last evening when baby started getting 22 cal Neosure formula by bottle, volumes 15-30 ml per feeding.   Mom pumped 60 ml this am and is choosing to focus on pumping and bottle feeding now.  Encouraged Mom to double pump every 2-3 hrs (when baby would be feeding)  for 15-30 mins during the day and every 3-4 hrs at night.  Mom states she has an Evenflow DEBP at home.  Mom knows it isn't the strongest breast pump.  Mom has Medicaid and thought she needed to wait until baby was born to call and get added to [facility].  Mom to call this am.  [facility] referral faxed by [facility].  Mom aware of how to disassemble pump parts and make a manual double pump.  Engorgement prevention and treatment reviewed.  Mom aware of OP lactation support available to her.  Mom has brochure and phone numbers identified for her.   Talked with Mom about the benefits of breast milk feeding.  Encouraged Mom to abstain from using illicit drugs while breastfeeding or breast milk pumping.  Mom made aware that baby's health could be affected negatively or even death with use of Cocaine or heroin with breastfeeding.  Mom is adamant she will remain clean as long as she is breastfeeding and beyond.  Praised Mom for this effort.  Advised baby to be fed formula if Mom has a set-back and for her to seek help with addiction.   Interventions Interventions: Breast feeding basics reviewed;Skin to skin;Breast massage;Hand express;DEBP;Hand pump  Lactation Tools Discussed/Used Tools: Bottle;Pump Breast pump type:  Double-Electric Breast Pump   Consult Status Consult Status: Complete Date: 08/28/19 Follow-up type: Call as needed    [name] 08/28/2019, 9:32 AM

## 2019-08-30 LAB — DRUG PROFILE, UR, 9 DRUGS (LABCORP)
Amphetamines, Urine: NEGATIVE ng/mL
Barbiturate, Ur: NEGATIVE ng/mL
Benzodiazepine Quant, Ur: NEGATIVE ng/mL
Cannabinoid Quant, Ur: POSITIVE ng/mL — AB
Cocaine (Metab.): NEGATIVE ng/mL
Methadone Screen, Urine: NEGATIVE ng/mL
Opiate Quant, Ur: NEGATIVE ng/mL
Phencyclidine, Ur: NEGATIVE ng/mL
Propoxyphene, Urine: NEGATIVE ng/mL

## 2020-02-09 ENCOUNTER — Encounter (HOSPITAL_COMMUNITY): Payer: Self-pay

## 2020-02-09 ENCOUNTER — Other Ambulatory Visit: Payer: Self-pay

## 2020-02-09 ENCOUNTER — Emergency Department (HOSPITAL_COMMUNITY)
Admission: EM | Admit: 2020-02-09 | Discharge: 2020-02-09 | Disposition: A | Discharge status: Home / Self Care | Payer: Medicaid Other | Attending: Emergency Medicine | Admitting: Emergency Medicine

## 2020-02-09 DIAGNOSIS — Y999 Unspecified external cause status: Secondary | ICD-10-CM | POA: Insufficient documentation

## 2020-02-09 DIAGNOSIS — Y939 Activity, unspecified: Secondary | ICD-10-CM | POA: Insufficient documentation

## 2020-02-09 DIAGNOSIS — Z5321 Procedure and treatment not carried out due to patient leaving prior to being seen by health care provider: Secondary | ICD-10-CM | POA: Insufficient documentation

## 2020-02-09 DIAGNOSIS — Y929 Unspecified place or not applicable: Secondary | ICD-10-CM | POA: Diagnosis not present

## 2020-02-09 DIAGNOSIS — W268XXA Contact with other sharp object(s), not elsewhere classified, initial encounter: Secondary | ICD-10-CM | POA: Diagnosis not present

## 2020-02-09 DIAGNOSIS — S61213A Laceration without foreign body of left middle finger without damage to nail, initial encounter: Secondary | ICD-10-CM | POA: Insufficient documentation

## 2020-02-09 NOTE — ED Triage Notes (Signed)
Patient arrived stating she cut her middle finger on her left hand on a broken phone about an hour ago. Bleeding controlled.

## 2020-08-30 ENCOUNTER — Other Ambulatory Visit: Payer: Self-pay

## 2020-08-30 ENCOUNTER — Ambulatory Visit (HOSPITAL_COMMUNITY): Payer: Medicaid Other | Admitting: Licensed Clinical Social Worker

## 2020-08-30 ENCOUNTER — Telehealth (HOSPITAL_COMMUNITY): Payer: Self-pay | Admitting: Licensed Clinical Social Worker

## 2020-08-30 NOTE — Telephone Encounter (Signed)
LCSW sent text link for video session per schedule. When pt failed to sign on LCSW attempted to call pt yet call not able to processed. Remained avail online until 1:14 with pt never signing on.

## 2020-09-07 ENCOUNTER — Ambulatory Visit (INDEPENDENT_AMBULATORY_CARE_PROVIDER_SITE_OTHER): Payer: Medicaid Other

## 2020-09-07 ENCOUNTER — Other Ambulatory Visit: Payer: Self-pay

## 2020-09-07 DIAGNOSIS — Z3200 Encounter for pregnancy test, result unknown: Secondary | ICD-10-CM

## 2020-09-07 DIAGNOSIS — Z3201 Encounter for pregnancy test, result positive: Secondary | ICD-10-CM | POA: Diagnosis not present

## 2020-09-07 DIAGNOSIS — O3680X0 Pregnancy with inconclusive fetal viability, not applicable or unspecified: Secondary | ICD-10-CM | POA: Diagnosis not present

## 2020-09-07 DIAGNOSIS — Z3687 Encounter for antenatal screening for uncertain dates: Secondary | ICD-10-CM | POA: Diagnosis not present

## 2020-09-07 LAB — POCT URINE PREGNANCY: Preg Test, Ur: POSITIVE — AB

## 2020-09-07 NOTE — Progress Notes (Signed)
Agree with A & P. 

## 2020-09-07 NOTE — Progress Notes (Signed)
..   Ms. Crutcher presents today for UPT. She has no unusual complaints. LMP:02-25-20    OBJECTIVE: Appears well, in no apparent distress.  OB History    Gravida  3   Para  1   Term  1   Preterm      AB  1   Living  1     SAB      IAB  1   Ectopic      Multiple  0   Live Births  1          Home UPT Result:Positive In-Office UPT result:Positive I have reviewed the patient's medical, obstetrical, social, and family histories, and medications.   ASSESSMENT: Positive pregnancy test Pt received U/S in office today for dating  PLAN Prenatal care to be completed at: [facility]

## 2020-09-28 ENCOUNTER — Encounter: Payer: Medicaid Other | Admitting: Obstetrics and Gynecology

## 2020-09-28 ENCOUNTER — Other Ambulatory Visit: Payer: Medicaid Other

## 2020-10-10 ENCOUNTER — Encounter: Payer: Medicaid Other | Admitting: Obstetrics

## 2020-10-25 ENCOUNTER — Encounter: Payer: Medicaid Other | Admitting: Obstetrics

## 2020-12-12 IMAGING — US OBSTETRIC <14 WK US AND TRANSVAGINAL OB US
1 series · 15 of 28 positions shown · non-contrast
Comparison: None.

CLINICAL DATA: Nine weeks pregnant with vaginal bleeding

EXAM:
OBSTETRIC <14 WK US AND TRANSVAGINAL OB US
TECHNIQUE: Both transabdominal and transvaginal ultrasound examinations were
performed for complete evaluation of the gestation as well as the
maternal uterus, adnexal regions, and pelvic cul-de-sac.
Transvaginal technique was performed to assess early pregnancy.

[Series 1: obstetric <14 wk us and transvaginal ob us · 42 acquisitions, 15 frames shown]
[im 1/42]
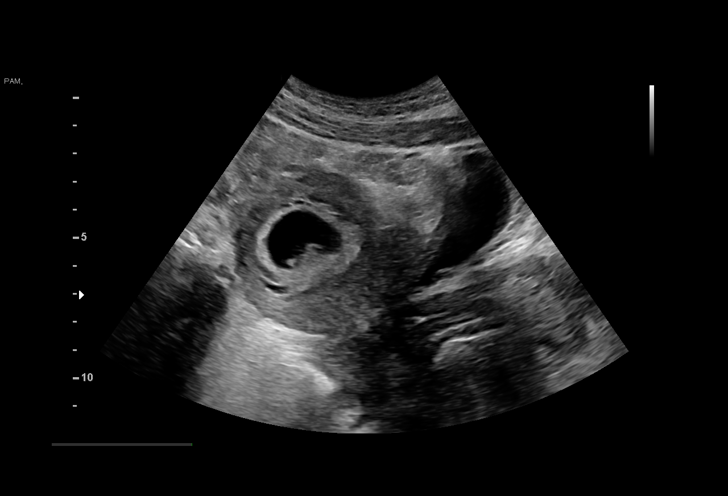
[im 4/42]
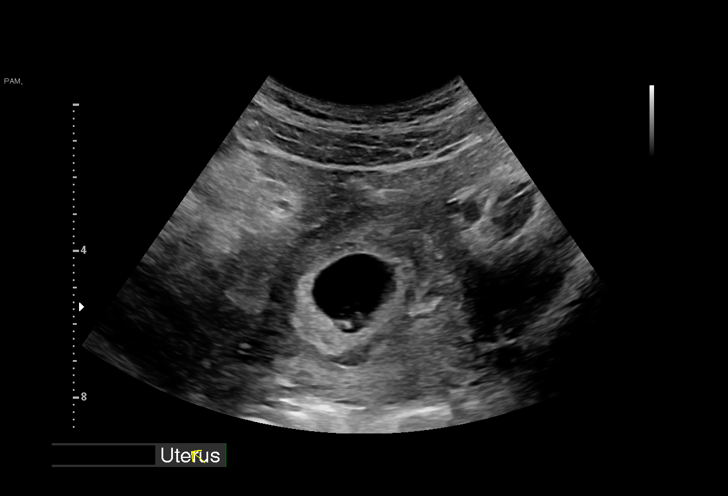
[im 7/42]
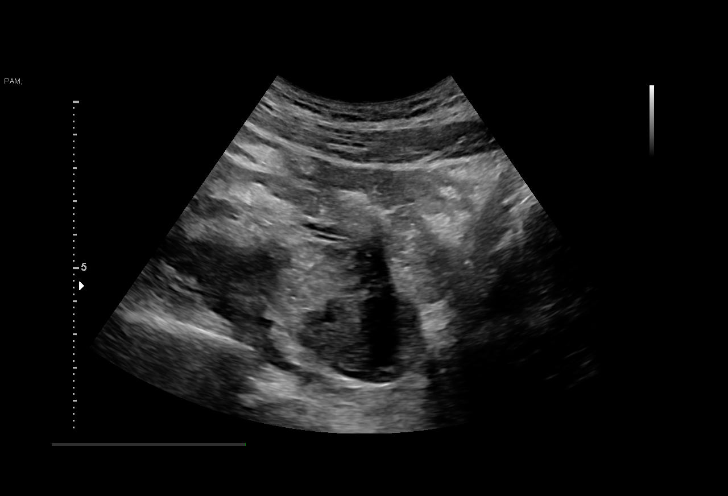
[im 10/42]
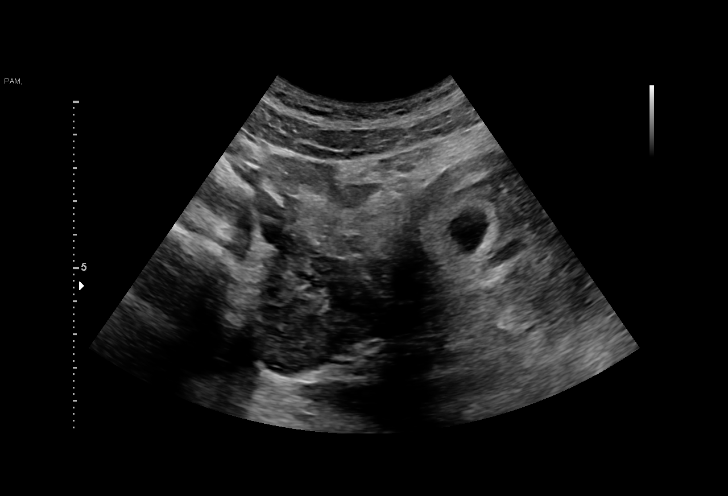
[im 13/42]
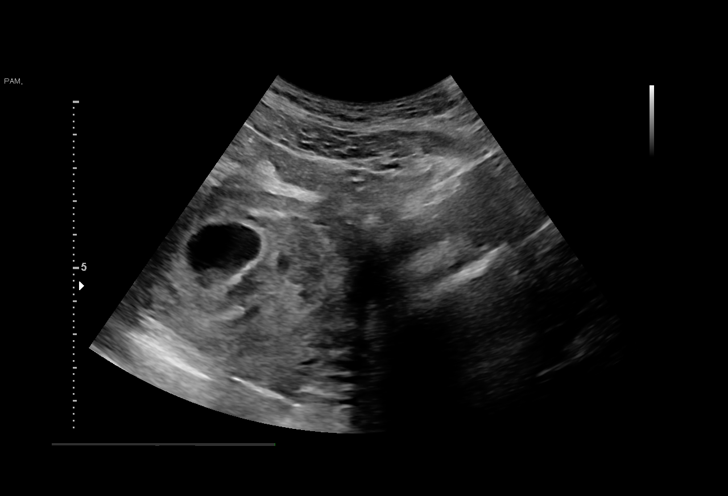
[im 16/42]
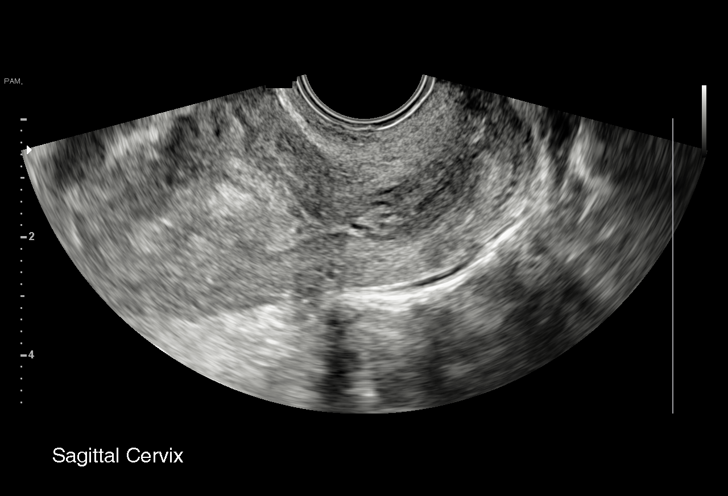
[im 19/42]
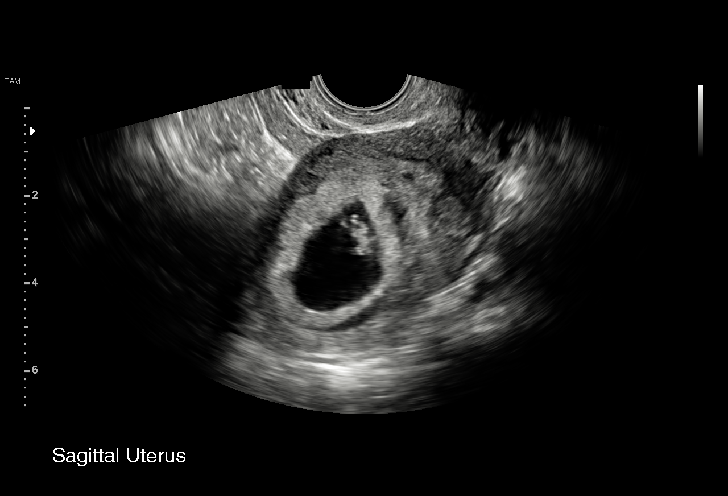
[im 22/42]
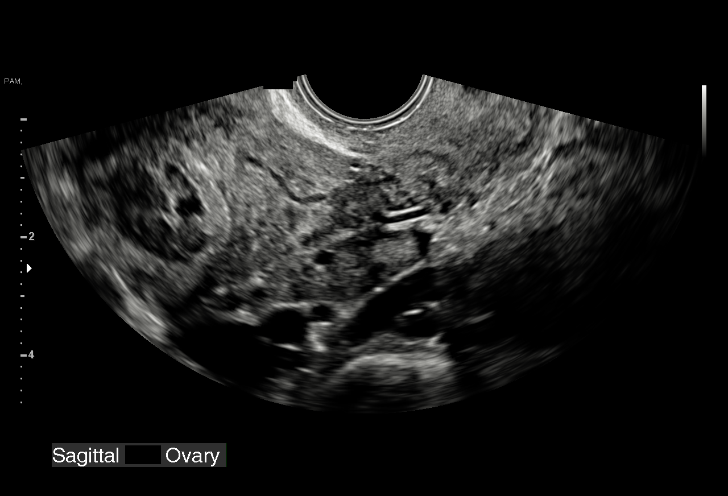
[im 23/42]
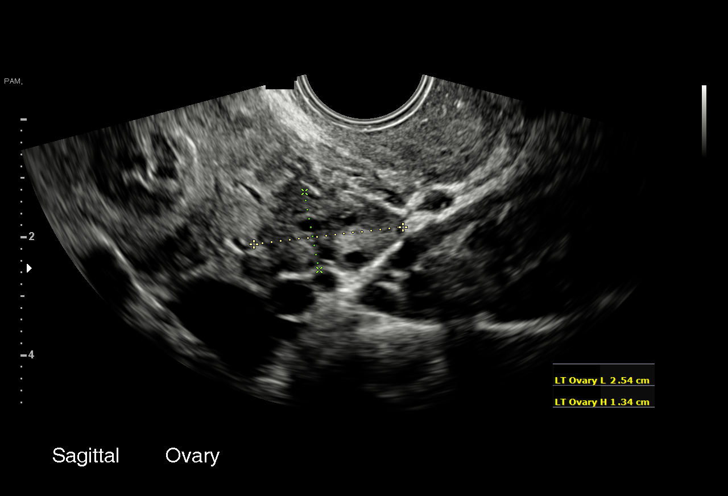
[im 26/42]
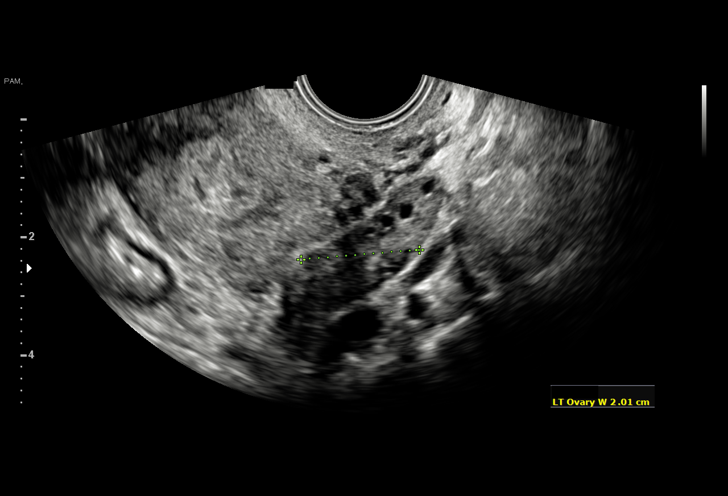
[im 29/42]
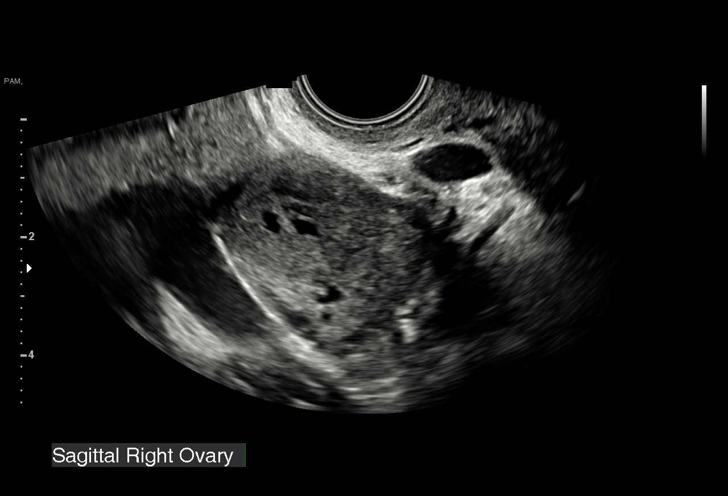
[im 32/42]
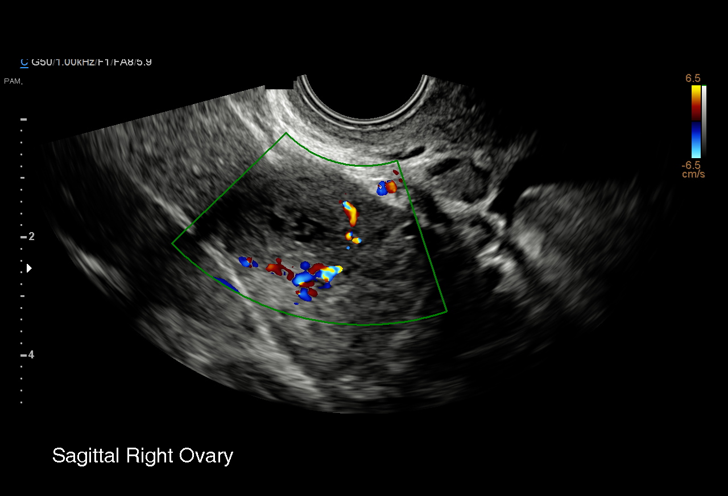
[im 35/42]
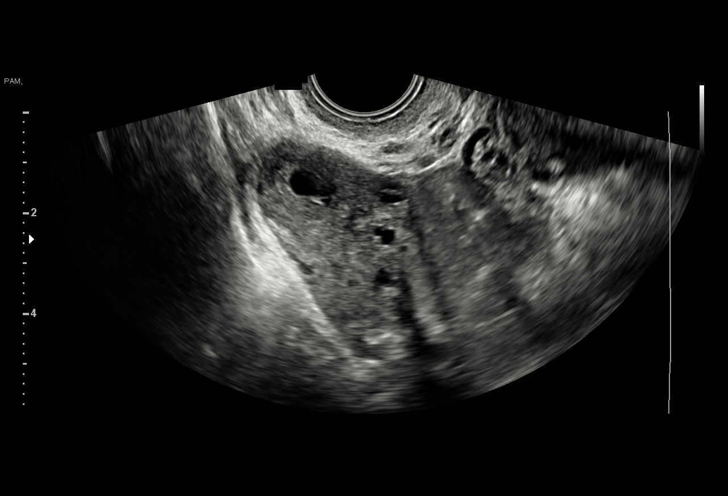
[im 38/42]
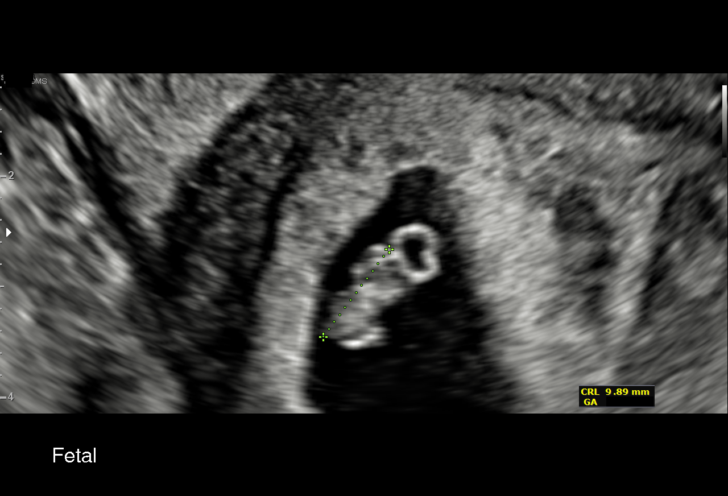
[im 42/42]
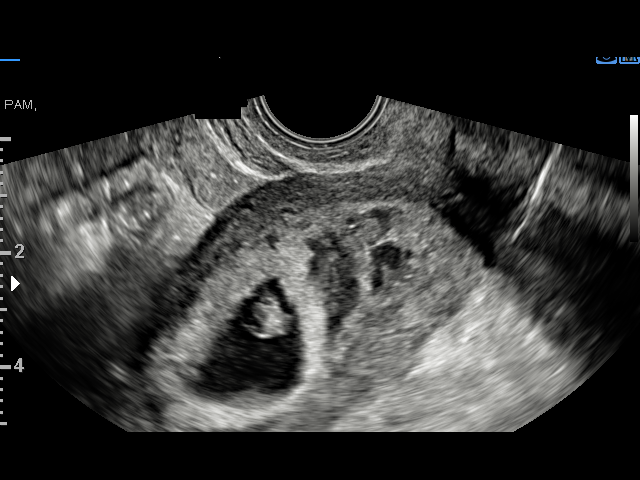

[15 of 28 positions shown; findings below may reference images not displayed]

FINDINGS: Intrauterine gestational sac: Present

Yolk sac:  Present

Embryo:  Present

Cardiac Activity: Present

Heart Rate: 140 bpm

CRL:  10.2 mm mm   7 w   1 d                  US EDC: 08/28/2019

Subchorionic hemorrhage: Moderate subchorionic hemorrhage is noted
measuring approximately 2.6 x 2.1 cm.

Maternal uterus/adnexae: Within normal limits.
IMPRESSION: Single live intrauterine gestation at 7 weeks 1 day. Moderate
subchorionic hemorrhage is noted.
